# Patient Record
Sex: Male | Born: 1940 | Race: White | Hispanic: No
Health system: Southern US, Community
[De-identification: ages and names within clinical notes are randomized; demographics above are authoritative.]

---

## 2021-03-12 ENCOUNTER — Other Ambulatory Visit: Payer: Self-pay

## 2021-03-12 ENCOUNTER — Inpatient Hospital Stay
Admission: EM | Admit: 2021-03-12 | Discharge: 2021-04-14 | DRG: 871 | Disposition: E | Payer: Medicare Other | Attending: Internal Medicine | Admitting: Internal Medicine

## 2021-03-12 ENCOUNTER — Emergency Department: Payer: Medicare Other

## 2021-03-12 DIAGNOSIS — A4189 Other specified sepsis: Secondary | ICD-10-CM | POA: Diagnosis present

## 2021-03-12 DIAGNOSIS — E870 Hyperosmolality and hypernatremia: Secondary | ICD-10-CM | POA: Diagnosis not present

## 2021-03-12 DIAGNOSIS — L723 Sebaceous cyst: Secondary | ICD-10-CM | POA: Diagnosis present

## 2021-03-12 DIAGNOSIS — N179 Acute kidney failure, unspecified: Secondary | ICD-10-CM | POA: Diagnosis present

## 2021-03-12 DIAGNOSIS — R778 Other specified abnormalities of plasma proteins: Secondary | ICD-10-CM

## 2021-03-12 DIAGNOSIS — M6282 Rhabdomyolysis: Secondary | ICD-10-CM | POA: Diagnosis present

## 2021-03-12 DIAGNOSIS — Z681 Body mass index (BMI) 19 or less, adult: Secondary | ICD-10-CM

## 2021-03-12 DIAGNOSIS — J9811 Atelectasis: Secondary | ICD-10-CM | POA: Diagnosis present

## 2021-03-12 DIAGNOSIS — Z66 Do not resuscitate: Secondary | ICD-10-CM | POA: Diagnosis not present

## 2021-03-12 DIAGNOSIS — J1282 Pneumonia due to coronavirus disease 2019: Secondary | ICD-10-CM | POA: Diagnosis present

## 2021-03-12 DIAGNOSIS — L729 Follicular cyst of the skin and subcutaneous tissue, unspecified: Secondary | ICD-10-CM | POA: Diagnosis not present

## 2021-03-12 DIAGNOSIS — E872 Acidosis: Secondary | ICD-10-CM | POA: Diagnosis present

## 2021-03-12 DIAGNOSIS — R680 Hypothermia, not associated with low environmental temperature: Secondary | ICD-10-CM | POA: Diagnosis present

## 2021-03-12 DIAGNOSIS — L89151 Pressure ulcer of sacral region, stage 1: Secondary | ICD-10-CM | POA: Diagnosis present

## 2021-03-12 DIAGNOSIS — R748 Abnormal levels of other serum enzymes: Secondary | ICD-10-CM | POA: Diagnosis present

## 2021-03-12 DIAGNOSIS — E860 Dehydration: Secondary | ICD-10-CM

## 2021-03-12 DIAGNOSIS — E43 Unspecified severe protein-calorie malnutrition: Secondary | ICD-10-CM | POA: Diagnosis present

## 2021-03-12 DIAGNOSIS — G9341 Metabolic encephalopathy: Secondary | ICD-10-CM | POA: Diagnosis present

## 2021-03-12 DIAGNOSIS — R7989 Other specified abnormal findings of blood chemistry: Secondary | ICD-10-CM

## 2021-03-12 DIAGNOSIS — R6520 Severe sepsis without septic shock: Secondary | ICD-10-CM | POA: Diagnosis present

## 2021-03-12 DIAGNOSIS — Z515 Encounter for palliative care: Secondary | ICD-10-CM

## 2021-03-12 DIAGNOSIS — U071 COVID-19: Secondary | ICD-10-CM | POA: Diagnosis present

## 2021-03-12 DIAGNOSIS — J9601 Acute respiratory failure with hypoxia: Secondary | ICD-10-CM | POA: Diagnosis present

## 2021-03-12 DIAGNOSIS — Z28310 Unvaccinated for covid-19: Secondary | ICD-10-CM

## 2021-03-12 DIAGNOSIS — Z7189 Other specified counseling: Secondary | ICD-10-CM | POA: Diagnosis not present

## 2021-03-12 DIAGNOSIS — R0602 Shortness of breath: Secondary | ICD-10-CM

## 2021-03-12 DIAGNOSIS — N184 Chronic kidney disease, stage 4 (severe): Secondary | ICD-10-CM | POA: Diagnosis present

## 2021-03-12 DIAGNOSIS — R222 Localized swelling, mass and lump, trunk: Secondary | ICD-10-CM | POA: Diagnosis present

## 2021-03-12 DIAGNOSIS — F0390 Unspecified dementia without behavioral disturbance: Secondary | ICD-10-CM | POA: Diagnosis present

## 2021-03-12 DIAGNOSIS — L8990 Pressure ulcer of unspecified site, unspecified stage: Secondary | ICD-10-CM | POA: Insufficient documentation

## 2021-03-12 DIAGNOSIS — R4182 Altered mental status, unspecified: Secondary | ICD-10-CM

## 2021-03-12 LAB — CBC WITH DIFFERENTIAL/PLATELET
Abs Immature Granulocytes: 0.04 10*3/uL (ref 0.00–0.07)
Basophils Absolute: 0 10*3/uL (ref 0.0–0.1)
Basophils Relative: 0 %
Eosinophils Absolute: 0 10*3/uL (ref 0.0–0.5)
Eosinophils Relative: 0 %
HCT: 51.3 % (ref 39.0–52.0)
Hemoglobin: 18.3 g/dL — ABNORMAL HIGH (ref 13.0–17.0)
Immature Granulocytes: 0 %
Lymphocytes Relative: 9 %
Lymphs Abs: 1 10*3/uL (ref 0.7–4.0)
MCH: 31.5 pg (ref 26.0–34.0)
MCHC: 35.7 g/dL (ref 30.0–36.0)
MCV: 88.3 fL (ref 80.0–100.0)
Monocytes Absolute: 0.7 10*3/uL (ref 0.1–1.0)
Monocytes Relative: 6 %
Neutro Abs: 9.9 10*3/uL — ABNORMAL HIGH (ref 1.7–7.7)
Neutrophils Relative %: 85 %
Platelets: 149 10*3/uL — ABNORMAL LOW (ref 150–400)
RBC: 5.81 MIL/uL (ref 4.22–5.81)
RDW: 13.4 % (ref 11.5–15.5)
WBC: 11.7 10*3/uL — ABNORMAL HIGH (ref 4.0–10.5)
nRBC: 0.2 % (ref 0.0–0.2)

## 2021-03-12 LAB — URINALYSIS, COMPLETE (UACMP) WITH MICROSCOPIC
Bacteria, UA: NONE SEEN
Bilirubin Urine: NEGATIVE
Glucose, UA: NEGATIVE mg/dL
Ketones, ur: NEGATIVE mg/dL
Leukocytes,Ua: NEGATIVE
Nitrite: NEGATIVE
Protein, ur: NEGATIVE mg/dL
Specific Gravity, Urine: 1.017 (ref 1.005–1.030)
Squamous Epithelial / HPF: NONE SEEN (ref 0–5)
pH: 5 (ref 5.0–8.0)

## 2021-03-12 LAB — URINE DRUG SCREEN, QUALITATIVE (ARMC ONLY)
Amphetamines, Ur Screen: NOT DETECTED
Barbiturates, Ur Screen: NOT DETECTED
Benzodiazepine, Ur Scrn: NOT DETECTED
Cannabinoid 50 Ng, Ur ~~LOC~~: NOT DETECTED
Cocaine Metabolite,Ur ~~LOC~~: NOT DETECTED
MDMA (Ecstasy)Ur Screen: NOT DETECTED
Methadone Scn, Ur: NOT DETECTED
Opiate, Ur Screen: NOT DETECTED
Phencyclidine (PCP) Ur S: NOT DETECTED
Tricyclic, Ur Screen: NOT DETECTED

## 2021-03-12 LAB — COMPREHENSIVE METABOLIC PANEL
ALT: 26 U/L (ref 0–44)
AST: 34 U/L (ref 15–41)
Albumin: 4.3 g/dL (ref 3.5–5.0)
Alkaline Phosphatase: 67 U/L (ref 38–126)
Anion gap: 14 (ref 5–15)
BUN: 72 mg/dL — ABNORMAL HIGH (ref 8–23)
CO2: 17 mmol/L — ABNORMAL LOW (ref 22–32)
Calcium: 9.7 mg/dL (ref 8.9–10.3)
Chloride: 106 mmol/L (ref 98–111)
Creatinine, Ser: 2.85 mg/dL — ABNORMAL HIGH (ref 0.61–1.24)
GFR, Estimated: 22 mL/min — ABNORMAL LOW (ref >60)
Glucose, Bld: 169 mg/dL — ABNORMAL HIGH (ref 70–99)
Potassium: 4.2 mmol/L (ref 3.5–5.1)
Sodium: 137 mmol/L (ref 135–145)
Total Bilirubin: 1.9 mg/dL — ABNORMAL HIGH (ref 0.3–1.2)
Total Protein: 7.9 g/dL (ref 6.5–8.1)

## 2021-03-12 LAB — RESP PANEL BY RT-PCR (FLU A&B, COVID) ARPGX2
Influenza A by PCR: NEGATIVE
Influenza B by PCR: NEGATIVE
SARS Coronavirus 2 by RT PCR: POSITIVE — AB

## 2021-03-12 LAB — CBG MONITORING, ED: Glucose-Capillary: 197 mg/dL — ABNORMAL HIGH (ref 70–99)

## 2021-03-12 LAB — LACTIC ACID, PLASMA
Lactic Acid, Venous: 2.6 mmol/L (ref 0.5–1.9)
Lactic Acid, Venous: 3 mmol/L (ref 0.5–1.9)

## 2021-03-12 LAB — PROCALCITONIN: Procalcitonin: 0.21 ng/mL

## 2021-03-12 LAB — PROTIME-INR
INR: 2.2 — ABNORMAL HIGH (ref 0.8–1.2)
Prothrombin Time: 24.4 seconds — ABNORMAL HIGH (ref 11.4–15.2)

## 2021-03-12 LAB — AMMONIA: Ammonia: 12 umol/L (ref 9–35)

## 2021-03-12 LAB — APTT: aPTT: 34 seconds (ref 24–36)

## 2021-03-12 LAB — TROPONIN I (HIGH SENSITIVITY)
Troponin I (High Sensitivity): 30 ng/L — ABNORMAL HIGH (ref <18)
Troponin I (High Sensitivity): 26 ng/L — ABNORMAL HIGH (ref <18)

## 2021-03-12 LAB — CK: Total CK: 746 U/L — ABNORMAL HIGH (ref 49–397)

## 2021-03-12 LAB — MAGNESIUM: Magnesium: 3 mg/dL — ABNORMAL HIGH (ref 1.7–2.4)

## 2021-03-12 LAB — ETHANOL: Alcohol, Ethyl (B): <10 mg/dL (ref <10)

## 2021-03-12 MED ORDER — METHYLPREDNISOLONE SODIUM SUCC 125 MG IJ SOLR
60.0000 mg | Freq: Two times a day (BID) | INTRAMUSCULAR | Status: AC
  Administered 2021-03-13 – 2021-03-14 (×4): 60 mg via INTRAVENOUS
  Filled 2021-03-12 (×4): qty 2

## 2021-03-12 MED ORDER — REMDESIVIR 100 MG IV SOLR
100.0000 mg | Freq: Every day | INTRAVENOUS | Status: AC
Start: 2021-03-13 — End: 2021-03-16
  Administered 2021-03-13 – 2021-03-16 (×4): 100 mg via INTRAVENOUS
  Filled 2021-03-12 (×4): qty 100

## 2021-03-12 MED ORDER — SODIUM CHLORIDE 0.9 % IV SOLN
200.0000 mg | Freq: Once | INTRAVENOUS | Status: AC
  Administered 2021-03-12: 200 mg via INTRAVENOUS
  Filled 2021-03-12: qty 200

## 2021-03-12 MED ORDER — HEPARIN SODIUM (PORCINE) 5000 UNIT/ML IJ SOLN
5000.0000 [IU] | Freq: Three times a day (TID) | INTRAMUSCULAR | Status: DC
  Administered 2021-03-12 – 2021-03-16 (×11): 5000 [IU] via SUBCUTANEOUS
  Filled 2021-03-12 (×11): qty 1

## 2021-03-12 MED ORDER — DEXAMETHASONE SODIUM PHOSPHATE 10 MG/ML IJ SOLN
6.0000 mg | Freq: Once | INTRAMUSCULAR | Status: AC
  Administered 2021-03-12: 6 mg via INTRAVENOUS
  Filled 2021-03-12: qty 1

## 2021-03-12 MED ORDER — SODIUM CHLORIDE 0.9 % IV SOLN: INTRAVENOUS | Status: DC

## 2021-03-12 MED ORDER — LACTATED RINGERS IV BOLUS
1000.0000 mL | Freq: Once | INTRAVENOUS | Status: AC
  Administered 2021-03-12: 1000 mL via INTRAVENOUS

## 2021-03-12 MED ORDER — ONDANSETRON HCL 4 MG PO TABS
4.0000 mg | ORAL_TABLET | Freq: Four times a day (QID) | ORAL | Status: DC | PRN
Start: 2021-03-12 — End: 2021-03-16

## 2021-03-12 MED ORDER — LABETALOL HCL 5 MG/ML IV SOLN: 5.0000 mg | INTRAVENOUS | Status: AC | PRN

## 2021-03-12 MED ORDER — HYDROCOD POLST-CPM POLST ER 10-8 MG/5ML PO SUER: 5.0000 mL | Freq: Every evening | ORAL | Status: DC | PRN

## 2021-03-12 MED ORDER — GUAIFENESIN-DM 100-10 MG/5ML PO SYRP: 10.0000 mL | ORAL_SOLUTION | ORAL | Status: DC | PRN

## 2021-03-12 MED ORDER — ACETAMINOPHEN 325 MG PO TABS: 325.0000 mg | ORAL_TABLET | Freq: Four times a day (QID) | ORAL | Status: AC | PRN

## 2021-03-12 MED ORDER — POTASSIUM CHLORIDE 10 MEQ/100ML IV SOLN: 10.0000 meq | INTRAVENOUS | Status: DC

## 2021-03-12 MED ORDER — ONDANSETRON HCL 4 MG/2ML IJ SOLN: 4.0000 mg | Freq: Four times a day (QID) | INTRAMUSCULAR | Status: DC | PRN

## 2021-03-12 NOTE — ED Triage Notes (Signed)
Pt to ED via [facility] from home. Per EMS neighbor called after checking on pt and found him on the floor unresponsive. Pt RA 90% and placed on 4L [place]. BP 135/80. CBG 157. Pt presents with strong ammonia smell and large cyst on right rib cage.

## 2021-03-12 NOTE — ED Notes (Signed)
Pt appears more alert after second back of fluids, now able to tell me his name and that he is in the hospital. Provider stated he was able to shake his head yes and no to questions.

## 2021-03-12 NOTE — Consult Note (Signed)
Remdesivir - Pharmacy Brief Note   O:  ALT: 26 CXR: N/A SpO2: 98% on 4L [place]   A/P:  Remdesivir 200 mg IVPB once followed by 100 mg IVPB daily x 4 days.   Darnelle Bos, PharmD 02/15/2021 7:33 PM

## 2021-03-12 NOTE — ED Notes (Signed)
admit Provider at bedside. 

## 2021-03-12 NOTE — H&P (Signed)
History and Physical   Matthew Aguilar HKV:[id number] DOB: [birth year] DOA: 03/10/2021  PCP: No primary care provider on file.  Patient coming from: Home via EMS  I have personally briefly reviewed patient's old medical records in [place].  Chief Concern: Unresponsive  HPI: Matthew Aguilar is a 80 y.o. male with medical history significant for no prior documented medical history in EHR, presents to the emergency department for chief concerns of found unresponsive.  At bedside patient is able to tell me his first name and his last name, his age, and that he is in the hospital.  He was not able to answer me the current calendar year.  He endorses cough and shortness of breath.  He shook his head in denial of headache, vision changes, fever, chest pain, abdominal pain, dysuria, diarrhea.  When asked how long he has had cough and shortness of breath, he responded several days.  He did shook his head nodding in agreement that he does feel better after the ED intervention.  He states he is not currently taking any medications.  Social history: He lives at home by himself and denies family.  He denies EtOH and recreational drug use.  Vaccination history: He denies being vaccinated for COVID-19  ROS: Constitutional: no weight change, no fever ENT/Mouth: no sore throat, no rhinorrhea Eyes: no eye pain, no vision changes Cardiovascular: no chest pain, + dyspnea,  no edema, no palpitations Respiratory: + cough, no sputum, no wheezing Gastrointestinal: no nausea, no vomiting, no diarrhea, no constipation Genitourinary: no urinary incontinence, no dysuria, no hematuria Musculoskeletal: no arthralgias, no myalgias Skin: no skin lesions, no pruritus, Neuro: + weakness, no loss of consciousness, no syncope Psych: no anxiety, no depression, + decrease appetite Heme/Lymph: no bruising, no bleeding  ED Course: Discussed with emergency medicine provider, patient requiring  hospitalization for acute hypoxic respiratory failure.  Per EDP, a neighbor came into check on patient and he was found minimally unresponsive.  EMS was called and per EMS patient was hypoxic in the mid 80s on room air.  EMS started patient on 4 L nasal cannula and his SPO2 improved to greater than 92%.  Vitals in the emergency department was remarkable for temperature 96.8, respiration rate of 21, heart rate 88, blood pressure 126/83, SPO2 of 99% on 4 L nasal cannula.  Labs in the emergency department was remarkable for COVID positive.  Influenza A/influenza B PCR were negative.  CK7 46, troponin was 38 and decreased to 26.  UA was negative for nitrates and leukocytes.  WBC was elevated at 11.7, hemoglobin 18.3, platelets 149, BUN 70, serum creatinine of 2.83, nonfasting blood glucose 169.  EDP ordered Decadron 6 mg IV, 2 L lactated ringer bolus, remdesivir IV.  Per EDP, at baseline patient is able to give thumbs up or thumbs down.  Patient was pan scanned and negative for cervical, thoracic, lumbar fracture.  CT of the head without contrast was negative for acute intracranial bleed.  CT of the chest abdomen pelvis without contrast was read as lobulated 12.5x 6 x 9.5 cm fluid density lesion along the right lateral upper abdomen subcutaneus soft tissues is incompletely evaluated on this noncontrast study. Finding could represent a sebaceous cyst versus liquefied hematoma versus likely benign neoplasm.  Assessment/Plan  Active Problems:   Acute hypoxemic respiratory failure (HCC)   CKD (chronic kidney disease), stage IV ([place])   # Altered mental status I suspect this is secondary to acute hypoxic respiratory failure  secondary to COVID-19 # Leukocytosis -Met sepsis criteria with increased respiration rate, increased lactic acid of 2.6, leukocytosis, source of COVID-19 - Ammonia is within normal limits at 12, ethanol was less than 10, UDS was positive - CT the head without contrast was read  as negative for acute large intracranial abnormality including hemorrhage - IV remdesivir per pharmacy, IV solumedrol 60 mg q12h initiated  - Procalcitonin baseline is 0.21, therefore antibiotic has not been initiated - Incentive spirometry and flutter valve for 10 reps every 2 hours while awake - Daily labs: CMP, CBC, D-dimer - Supplemental oxygen to maintain SPO2 goal of greater than 88% - Continue airborne and contact precautions - Admit to MedSurg, inpatient, telemetry  # Elevated CK-I suspect this is multifactorial including acute kidney injury and COVID-19 infection - Status post 2 L lactated Ringer's bolus per EDP - In setting of COVID-19 infection, with concurrent dry mucosa, I will start IVF at normal saline 75 mL/h, for 13 hours to complete 1 bag - Check CK in the a.m.  # Serum creatinine on presentation is 2.85, BUN 72, EGFR 22 - No baseline serum creatinine to compare - This could be acute kidney injury in setting of covid 19 versus CKD stage IV  # Mild hypothermia-Bair hugger ordered # Right lobulated fluid-filled cysts-present admission  # Right-sided anterior mid rib loculated cyst-present admission, images taken in media  # A.m. team to complete med reconciliation  Chart reviewed.  No prior medical records in chart  DVT prophylaxis: Heparin 5000 units every 8 hours subcutaneous Code Status: Limited.  He shook his head in denial of chest compression.  He does nod his head stating that if he needed it, he would be okay with intubation. Diet: Heart healthy Family Communication: No Disposition Plan: Pending clinical course Consults called: None at this time Admission status: MedSurg, inpatient, telemetry ordered  No past medical history on file.  Social History:  has no history on file for tobacco use, alcohol use, and drug use.  Not on File No family history on file. Family history: Family history reviewed and not pertinent  Prior to Admission medications    Not on File   Physical Exam: Vitals:   03/13/2021 1755 02/21/2021 1955 03/02/2021 2000 02/19/2021 2005  BP:  136/90 138/90 (!) 147/92  Pulse:  77 77 83  Resp:  (!) _0 Temp:      TempSrc:      SpO2:  99% 99% 99%  Weight: 62.6 kg     Height: 6' (1.829 m)      Constitutional: appears age-appropriate, poorly kept, frail, NAD, calm, comfortable Eyes: PERRL, lids and conjunctivae normal ENMT: Mucous membranes are moist. Posterior pharynx clear of any exudate or lesions. Age-appropriate dentition. Hearing appropriate Neck: normal, supple, no masses, no thyromegaly Respiratory: clear to auscultation bilaterally, no wheezing, no crackles. Normal respiratory effort. No accessory muscle use.  Cardiovascular: Regular rate and rhythm, no murmurs / rubs / gallops. No extremity edema. 2+ pedal pulses. No carotid bruits.  Abdomen: Scaphoid abdomen, no tenderness, no masses palpated, no hepatosplenomegaly. Bowel sounds positive. Right upper abdomen loculated cysts.  Musculoskeletal: no clubbing / cyanosis. No joint deformity upper and lower extremities.  Generalized extremity weakness and muscle wasting/atrophic.  Generalized decreased extremity muscle tone. Skin: no rashes, lesions, ulcers. No induration Neurologic: Sensation intact. Strength 5/5 in all 4.  Psychiatric: Patient is minimally verbal and does not volunteer numerous verbal statements therefore unable to assess patient's judgment and insight. Alert  and oriented x self, age, location.  Flat affect  EKG: independently reviewed, showing sinus rhythm with rate of 97, QTc 520  Imaging on Admission: I personally reviewed and I agree with radiologist reading as below.  FINDINGS: CT HEAD FINDINGS  Brain:  Cerebral ventricle sizes are concordant with the degree of cerebral volume loss. Patchy and confluent areas of decreased attenuation are noted throughout the deep and periventricular white matter of the cerebral hemispheres bilaterally,  compatible with chronic 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  No evidence of large-territorial acute infarction. No parenchymal hemorrhage. No mass lesion. No extra-axial collection.  No mass effect or midline shift. No hydrocephalus. Basilar cisterns are patent.  Vascular: No hyperdense vessel.  Skull: No acute fracture or focal lesion.  Sinuses/Orbits: Mucosal thickening of the right frontal sinus. Otherwise the remaining paranasal sinuses and mastoid air cells are clear. The orbits are unremarkable.  Other: None.  CT CERVICAL FINDINGS  Alignment: Grade 1 anterolisthesis of C4 on C5 likely due to degenerative changes.  Skull base and vertebrae: Multilevel moderate to severe degenerative changes of the spine with associated multilevel severe osseous neural foraminal stenosis. No severe osseous central canal stenosis. No acute fracture. No aggressive appearing focal osseous lesion or focal pathologic process.  Soft tissues and spinal canal: No prevertebral fluid or swelling. No visible canal hematoma.  Upper chest: Unremarkable.  Other: Patient is edentulous.  CT CHEST: Ports and Devices: None.  Lungs/airways:  Bilateral lower lobe subsegmental atelectasis. Question bilateral lower lobe reticulations with limited evaluation due to motion artifact. No focal consolidation. Scattered calcified and noncalcified pulmonary micronodules. No pulmonary mass. No pulmonary contusion or laceration. No pneumatocele formation.  The central airways are patent.  Pleura: No pleural effusion. No pneumothorax. No hemothorax.  Lymph Nodes: Limited evaluation for hilar lymphadenopathy on this noncontrast study. No mediastinal or axillary lymphadenopathy.  Mediastinum:  No pneumomediastinum.  The thoracic aorta is normal in caliber. Atherosclerotic plaque. Four-vessel coronary calcifications. The heart is normal in size. No significant pericardial  effusion. The esophagus is unremarkable. Possible tiny hiatal hernia.  The thyroid is unremarkable.  Chest Wall / Breasts: No chest wall mass.  Musculoskeletal: No acute displaced rib or sternal fracture. Limited evaluation due to respiratory motion artifact.  Visualized portions of bilateral upper extremities grossly unremarkable.  CT ABDOMEN / PELVIS: Liver: Not enlarged. No focal lesion.  Biliary System: The gallbladder distended with fluid with otherwise no radio-opaque gallstones. No biliary ductal dilatation.  Pancreas: Limited evaluation due to respiratory motion artifact. Diffusely atrophic. No focal lesion. Otherwise normal pancreatic contour. No surrounding inflammatory changes. No main pancreatic ductal dilatation. Spleen: Not enlarged. No focal lesion.  Adrenal Glands: No nodularity bilaterally.  Kidneys:  Bilateral renal cortical scarring. Bilateral nephrolithiasis measuring up to 8 mm on the left and 4 mm on the right. No hydroureteronephrosis bilaterally. Three consecutive calcifications within the left pelvis measuring up to 9 mm likely within the distal left ureter (5:90-93). No right ureterolithiasis. No contour deforming renal mass.  The urinary bladder is unremarkable.  Bowel: No small or large bowel wall thickening or dilatation. Diffuse colonic diverticulosis. Poorly visualized transverse colon due to motion artifact. The appendix is unremarkable.  Mesentery, Omentum, and Peritoneum: No simple free fluid ascites. No pneumoperitoneum. No mesenteric hematoma identified. No organized fluid collection.  Pelvic Organs: The prostate is prominent..  Lymph Nodes: No abdominal, pelvic, inguinal lymphadenopathy.  Vasculature: No abdominal aorta or iliac aneurysm.  Musculoskeletal:  There is a lobulated 12.5 x 6  by 9.5 cm fluid density lesion along right lateral upper abdomen subcutaneus soft tissues (2:73, 5:38).  Bilateral, right greater than  left, fat containing inguinal hernias.  No acute pelvic fracture.  CT THORACIC SPINE FINDINGS  Alignment: Normal.  Vertebrae: Multilevel mild degenerative changes of the spine. No acute fracture or focal pathologic process.No severe osseous neural foraminal or central canal stenosis.  Paraspinal and other soft tissues: Negative.  CT LUMBAR SPINE FINDINGS  Segmentation: 5 lumbar type vertebrae.  Alignment: Normal.  Vertebrae: Age-indeterminate, likely chronic, L1 compression fracture with greater than 20% height loss. Otherwise no definite acute fracture or focal pathologic process. Multilevel mild degenerative changes of the spine with moderate severe facet arthropathy of the lower lumbar spine. No severe osseous neural foraminal or central canal stenosis.  Paraspinal and other soft tissues: Negative.  IMPRESSION: 1. 2. No acute intracranial abnormality. 3. No acute displaced fracture or traumatic listhesis of the cervical spine. 4. No acute traumatic injury to the chest, abdomen, or pelvis with limited evaluation on this noncontrast study. Please see below regarding positive non-traumatic findings.  5. No definite acute fracture or traumatic malalignment of the thoracic or lumbar spine. Age-indeterminate, likely chronic, L1 compression fracture with greater than 20% height loss. Correlate with tenderness to palpation to evaluate for an acute component. 6. Some limited evaluation due to respiratory motion artifact.  Other imaging findings of potential clinical significance:  1. Three consecutive calcifications within the left pelvis measuring up to 9 mm likely within the distal left ureter. No associated proximal hydroureteronephrosis. Recommend correlation with urinalysis. 2. Bilateral nonobstructive nephrolithiasis measuring up to 8 mm on the left and 4 mm on the right. 3. A lobulated 12.5 x 6 x 9.5 cm fluid density lesion along the right lateral upper abdomen  subcutaneus soft tissues is incompletely evaluated on this noncontrast study. Finding could represent a sebaceous cyst versus liquefied hematoma versus likely benign neoplasm. Correlate with physical exam. Correlate with prior cross-sectional imaging to evaluate for stability. 4. Scattered pulmonary micronodules calcified and noncalcified. No follow-up needed if patient is low-risk (and has no known or suspected primary neoplasm). Non-contrast chest CT can be considered in 12 months if patient is high-risk. This recommendation follows the consensus statement: Guidelines for Management of Incidental Pulmonary Nodules Detected on CT Images: From the Fleischner Society 2017; Radiology 2017; 284:228-243. 5. Diffuse colonic diverticulosis with no acute diverticulitis. 6. Prominent prostate 7. Bilateral, right greater than left, fat containing inguinal hernias. 8. Aortic Atherosclerosis (ICD10-I70.0).  Electronically Signed By: Iven Finn M.D. On: 02/14/2021 20:28  Labs on Admission: I have personally reviewed following labs  CBC: Recent Labs  Lab 02/15/2021 1756  WBC 11.7*  NEUTROABS 9.9*  HGB 18.3*  HCT 51.3  MCV 88.3  PLT 818*   Basic Metabolic Panel: Recent Labs  Lab 02/25/2021 1756  NA 137  K 4.2  CL 106  CO2 17*  GLUCOSE 169*  BUN 72*  CREATININE 2.85*  CALCIUM 9.7  MG 3.0*   GFR: Estimated Creatinine Clearance: 18.3 mL/min (A) (by C-G formula based on SCr of 2.85 mg/dL (H)). Liver Function Tests: Recent Labs  Lab 02/16/2021 1756  AST 34  ALT 26  ALKPHOS 67  BILITOT 1.9*  PROT 7.9  ALBUMIN 4.3   No results for input(s): LIPASE, AMYLASE in the last 168 hours. Recent Labs  Lab 03/06/2021 2009  AMMONIA 12   Coagulation Profile: Recent Labs  Lab 02/12/2021 1756  INR 2.2*   Cardiac Enzymes: Recent Labs  Lab  02/15/2021 1756  CKTOTAL 746*   CBG: Recent Labs  Lab 02/20/2021 1801  GLUCAP 197*   Urine analysis:    Component Value Date/Time    COLORURINE AMBER (A) 02/20/2021 1756   APPEARANCEUR CLEAR (A) 03/06/2021 1756   LABSPEC 1.017 02/16/2021 1756   PHURINE 5.0 03/07/2021 1756   GLUCOSEU NEGATIVE 02/23/2021 1756   HGBUR SMALL (A) 03/02/2021 1756   BILIRUBINUR NEGATIVE 03/03/2021 1756   KETONESUR NEGATIVE 02/15/2021 1756   PROTEINUR NEGATIVE 03/04/2021 1756   NITRITE NEGATIVE 02/26/2021 1756   LEUKOCYTESUR NEGATIVE 02/18/2021 1756   Dr. Tobie Poet [facility]  If 7PM-7AM, please contact overnight-coverage provider If 7AM-7PM, please contact day coverage provider [email or web address]  02/12/2021, 9:08 PM

## 2021-03-12 NOTE — ED Provider Notes (Signed)
[facility] Emergency Department Provider Note  ____________________________________________   Event Date/Time   First MD Initiated Contact with Patient 02/16/2021 1753     (approximate)  I have reviewed the triage vital signs and the nursing notes.   HISTORY  Chief Complaint Altered Mental Status (Unresponsive )   HPI Matthew Aguilar is a 80 y.o. male patient presents without known medical history of EMS from home after he was reportedly found on the floor surrounded by urine and feces when a neighbor went to check on him.  Per EMS he was virtually unresponsive with them and was hypoxic in the mid 80s on room air requiring 4 L nasal cannula to maintain SPO2 greater than 92%.  Otherwise he was with stable vitals and normal glucose but unable provide any additional history.  No additional history is available from EMS.  No contact is available to obtain collateral on patient arrival.         No past medical history on file.  There are no problems to display for this patient.     Prior to Admission medications   Not on File    Allergies Patient has no allergy information on record.  No family history on file.  Social History    Review of Systems  Review of Systems  Unable to perform ROS: Mental status change     ____________________________________________   PHYSICAL EXAM:  VITAL SIGNS: ED Triage Vitals [03/09/2021 1751]  Enc Vitals Group     BP 126/83     Pulse Rate 88     Resp (!) 21     Temp (!) 96.8 F (36 C)     Temp Source Rectal     SpO2 98 %     Weight      Height      Head Circumference      Peak Flow      Pain Score      Pain Loc      Pain Edu?      Excl. in [place]?    Vitals:   03/10/2021 1751 03/09/2021 1955  BP: 126/83 136/90  Pulse: 88 77  Resp: (!) 21 (!) 26  Temp: (!) 96.8 F (36 C)   SpO2: 98% 99%   Physical Exam Vitals and nursing note reviewed.  Constitutional:      Appearance: He is  ill-appearing.  HENT:     Head: Normocephalic.     Right Ear: External ear normal.     Left Ear: External ear normal.     Nose: Nose normal.     Mouth/Throat:     Mouth: Mucous membranes are dry.  Eyes:     General: No scleral icterus.    Pupils: Pupils are equal, round, and reactive to light.  Cardiovascular:     Rate and Rhythm: Normal rate and regular rhythm.  Pulmonary:     Effort: No respiratory distress.  Abdominal:     General: There is no distension.     Tenderness: There is no right CVA tenderness or left CVA tenderness.  Musculoskeletal:        General: Signs of injury present.     Right lower leg: No edema.     Left lower leg: No edema.  Skin:    General: Skin is dry.     Capillary Refill: Capillary refill takes more than 3 seconds.  Neurological:     Mental Status: He is disoriented and confused.  GCS: GCS eye subscore is 4. GCS verbal subscore is 4. GCS motor subscore is 6.    No step-offs or deformities over the C/C/L-spine.  2+ radial and DP pulses.  There are some scattered bruising over the left lower leg without effusion deformity or apparent significant tenderness of the bilateral hips, knees or ankles.  Patient withdraws to noxious stimuli in all extremities.  He is able to give examiner thumbs up with both upper extremities.  Is able to track this examiner appropriately with his eyes and open his mouth on command.  PERRLA.  No obvious trauma to the face scalp head or neck.  There is a fairly large mass in the right lower chest just above the 12th rib approximately 8 to 10 cm in diameter circular. ____________________________________________   LABS (all labs ordered are listed, but only abnormal results are displayed)  Labs Reviewed  RESP PANEL BY RT-PCR (FLU A&B, COVID) ARPGX2 - Abnormal; Notable for the following components:      Result Value   SARS Coronavirus 2 by RT PCR POSITIVE (*)    All other components within normal limits  COMPREHENSIVE  METABOLIC PANEL - Abnormal; Notable for the following components:   CO2 17 (*)    Glucose, Bld 169 (*)    BUN 72 (*)    Creatinine, Ser 2.85 (*)    Total Bilirubin 1.9 (*)    GFR, Estimated 22 (*)    All other components within normal limits  CBC WITH DIFFERENTIAL/PLATELET - Abnormal; Notable for the following components:   WBC 11.7 (*)    Hemoglobin 18.3 (*)    Platelets 149 (*)    Neutro Abs 9.9 (*)    All other components within normal limits  PROTIME-INR - Abnormal; Notable for the following components:   Prothrombin Time 24.4 (*)    INR 2.2 (*)    All other components within normal limits  URINALYSIS, COMPLETE (UACMP) WITH MICROSCOPIC - Abnormal; Notable for the following components:   Color, Urine AMBER (*)    APPearance CLEAR (*)    Hgb urine dipstick SMALL (*)    All other components within normal limits  MAGNESIUM - Abnormal; Notable for the following components:   Magnesium 3.0 (*)    All other components within normal limits  CK - Abnormal; Notable for the following components:   Total CK 746 (*)    All other components within normal limits  CBG MONITORING, ED - Abnormal; Notable for the following components:   Glucose-Capillary 197 (*)    All other components within normal limits  TROPONIN I (HIGH SENSITIVITY) - Abnormal; Notable for the following components:   Troponin I (High Sensitivity) 30 (*)    All other components within normal limits  CULTURE, BLOOD (SINGLE)  URINE CULTURE  APTT  PROCALCITONIN  ETHANOL  URINE DRUG SCREEN, QUALITATIVE ([facility] ONLY)  LACTIC ACID, PLASMA  AMMONIA  LACTIC ACID, PLASMA  TROPONIN I (HIGH SENSITIVITY)   ____________________________________________  EKG  Sinus rhythm with a ventricular of 97, low voltage throughout, Q waves in inferior leads and QTc interval of 520 without other evidence of clear ischemia or significant arrhythmia. ____________________________________________  RADIOLOGY  ED MD interpretation: CT head  shows no evidence of acute intracranial hemorrhage skull fracture or other clear acute process.  There is no acute traumatic listhesis or injury noted on the C-spine.  CT chest on pelvis has no evidence of acute orthopedic or visceral injury or bleeding.  Age-indeterminate likely chronic L1 compression  fracture.  There are also some calcifications in the left renal pelvis and the distal ureter but not associate with proximal hydronephrosis or perinephric stranding.  There is also bilateral nonobstructive kidney stones and a lobulated fluid density along the right lateral upper abdomen biopsy representing sebaceous cyst versus hematoma versus benign neoplasm.  There is also evidence of colonic diverticulosis without diverticulitis and a prominent prostate as well as bilateral greater on the right and left inguinal hernias.  Aortic atherosclerosis.  No other clear acute process on the chest abdomen or pelvis.  Official radiology report(s): No results found. 1. 2. No acute intracranial abnormality. 3. No acute displaced fracture or traumatic listhesis of the cervical spine. 4. No acute traumatic injury to the chest, abdomen, or pelvis with limited evaluation on this noncontrast study. Please see below regarding positive non-traumatic findings.  5. No definite acute fracture or traumatic malalignment of the thoracic or lumbar spine. Age-indeterminate, likely chronic, L1 compression fracture with greater than 20% height loss. Correlate with tenderness to palpation to evaluate for an acute component. 6. Some limited evaluation due to respiratory motion artifact.  Other imaging findings of potential clinical significance:  1. Three consecutive calcifications within the left pelvis measuring up to 9 mm likely within the distal left ureter. No associated proximal hydroureteronephrosis. Recommend correlation with urinalysis. 2. Bilateral nonobstructive nephrolithiasis measuring up to 8 mm on the left  and 4 mm on the right. 3. A lobulated 12.5 x 6 x 9.5 cm fluid density lesion along the right lateral upper abdomen subcutaneus soft tissues is incompletely evaluated on this noncontrast study. Finding could represent a sebaceous cyst versus liquefied hematoma versus likely benign neoplasm. Correlate with physical exam. Correlate with prior cross-sectional imaging to evaluate for stability. 4. Scattered pulmonary micronodules calcified and noncalcified. No follow-up needed if patient is low-risk (and has no known or suspected primary neoplasm). Non-contrast chest CT can be considered in 12 months if patient is high-risk. This recommendation follows the consensus statement: Guidelines for Management of Incidental Pulmonary Nodules Detected on CT Images: From the Fleischner Society 2017; Radiology 2017; 284:228-243. 5. Diffuse colonic diverticulosis with no acute diverticulitis. 6. Prominent prostate 7. Bilateral, right greater than left, fat containing inguinal hernias. 8. Aortic Atherosclerosis (ICD10-I70.0).  ____________________________________________   PROCEDURES  Procedure(s) performed (including Critical Care):  Procedures  45 minutes of critical care time performed.  This was in part to treat patient's acute hypoxic respiratory failure. ____________________________________________   INITIAL IMPRESSION / ASSESSMENT AND PLAN / ED COURSE      Patient presents with above-stated history and exam after being found down by a neighbor in his home.  He was reportedly surrounded by urine and feces minimally responsive EMS.  On arrival he is slightly tachypneic but afebrile requiring 2 L to maintain SPO2 greater than 94%.  On room air his SPO2 is 86%.  He does appear extremely dry and is only able to state his name with significant verbal stimulation.  He is able to give this examiner thumbs up with both hands and while he does not move his lower extremities on command he does  withdraw both to noxious stimuli.  He has some scattered bruising in his left lower leg but no other obvious evidence of trauma.  He does have a notable fairly large mass on his right lower chest.  Differential with regards to precipitating stimulus include ACS, arrhythmia, CVA, sepsis, metabolic derangements, toxic ingestion  Will order for sepsis work-up in addition to EKG troponin and  CK.  To assess for possible acute injuries SAH subacute CVA or foci of infection we will obtain full trauma pan scans including CT head, C-spine and chest abdomen pelvis.  Given appearance of fairly significant dehydration on exam we will administer 1 L of IV fluids at this time.  CMP remarkable for bicarb of 17 with normal anion gap, BUN of 72 and a creatinine of 2.85.  CBC with a leukocytosis with WBC count of 11.7 and hemoglobin of 18.3.  UA has small hemoglobin but otherwise is unremarkable for evidence of infection or blood.  Initial troponin slight elevated at 30 which I suspect represents to mild demand ischemia in the setting of fairly severe dehydration and COVID, we will plan to trend this.  COVID PCR did return positive.  Given patient is requiring oxygen will start on Decadron and remdesivir.  Magnesium 3.  CK slightly elevated at 746.  Procalcitonin 0.21.  Ethanol undetectable.  UDS negative.  INR is 2.2.   CT head shows no evidence of acute intracranial hemorrhage skull fracture or other clear acute process.  There is no acute traumatic listhesis or injury noted on the C-spine.  CT chest on pelvis has no evidence of acute orthopedic or visceral injury or bleeding.  Age-indeterminate likely chronic L1 compression fracture.  There are also some calcifications in the left renal pelvis and the distal ureter but not associate with proximal hydronephrosis or perinephric stranding.  There is also bilateral nonobstructive kidney stones and a lobulated fluid density along the right lateral upper abdomen biopsy  representing sebaceous cyst versus hematoma versus benign neoplasm.  There is also evidence of colonic diverticulosis without diverticulitis and a prominent prostate as well as bilateral greater on the right and left inguinal hernias.  Aortic atherosclerosis.  No other clear acute process on the chest abdomen or pelvis.  Patient has new oxygen requirement and minimally elevated procalcitonin with a WBC count 11.7 I suspect this is related to old COVID infection and trauma and I have a low suspicion for acute bacterial infection or sepsis at this time.  We will hydrate with IV fluids for antibiotics.  Patient started on remdesivir and Decadron.  I will admit to medical service for further evaluation and management.      ____________________________________________   FINAL CLINICAL IMPRESSION(S) / ED DIAGNOSES  Final diagnoses:  AMS (altered mental status)  Acute respiratory failure with hypoxia (HCC)  AKI (acute kidney injury) ([place])  Dehydration  Troponin I above reference range  COVID    Medications  remdesivir 200 mg in sodium chloride 0.9% 250 mL IVPB (has no administration in time range)    Followed by  remdesivir 100 mg in sodium chloride 0.9 % 100 mL IVPB (has no administration in time range)  lactated ringers bolus 1,000 mL (0 mLs Intravenous Stopped 02/20/2021 2012)  lactated ringers bolus 1,000 mL (1,000 mLs Intravenous New Bag/Given 02/28/2021 2009)  dexamethasone (DECADRON) injection 6 mg (6 mg Intravenous Given 03/02/2021 2009)     ED Discharge Orders     None        Note:  This document was prepared using Dragon voice recognition software and may include unintentional dictation errors.    Lucrezia Starch, MD 02/20/2021 2105

## 2021-03-13 DIAGNOSIS — J9601 Acute respiratory failure with hypoxia: Secondary | ICD-10-CM | POA: Diagnosis not present

## 2021-03-13 DIAGNOSIS — L8990 Pressure ulcer of unspecified site, unspecified stage: Secondary | ICD-10-CM | POA: Insufficient documentation

## 2021-03-13 LAB — CBC WITH DIFFERENTIAL/PLATELET
Abs Immature Granulocytes: 0.05 10*3/uL (ref 0.00–0.07)
Basophils Absolute: 0 10*3/uL (ref 0.0–0.1)
Basophils Relative: 0 %
Eosinophils Absolute: 0 10*3/uL (ref 0.0–0.5)
Eosinophils Relative: 0 %
HCT: 43.4 % (ref 39.0–52.0)
Hemoglobin: 15.8 g/dL (ref 13.0–17.0)
Immature Granulocytes: 1 %
Lymphocytes Relative: 7 %
Lymphs Abs: 0.5 10*3/uL — ABNORMAL LOW (ref 0.7–4.0)
MCH: 33 pg (ref 26.0–34.0)
MCHC: 36.4 g/dL — ABNORMAL HIGH (ref 30.0–36.0)
MCV: 90.6 fL (ref 80.0–100.0)
Monocytes Absolute: 0.4 10*3/uL (ref 0.1–1.0)
Monocytes Relative: 5 %
Neutro Abs: 6.6 10*3/uL (ref 1.7–7.7)
Neutrophils Relative %: 87 %
Platelets: 102 10*3/uL — ABNORMAL LOW (ref 150–400)
RBC: 4.79 MIL/uL (ref 4.22–5.81)
RDW: 13.2 % (ref 11.5–15.5)
WBC: 7.6 10*3/uL (ref 4.0–10.5)
nRBC: 0 % (ref 0.0–0.2)

## 2021-03-13 LAB — BLOOD CULTURE ID PANEL (REFLEXED) - BCID2

## 2021-03-13 LAB — COMPREHENSIVE METABOLIC PANEL
ALT: 20 U/L (ref 0–44)
AST: 25 U/L (ref 15–41)
Albumin: 3 g/dL — ABNORMAL LOW (ref 3.5–5.0)
Alkaline Phosphatase: 53 U/L (ref 38–126)
Anion gap: 11 (ref 5–15)
BUN: 56 mg/dL — ABNORMAL HIGH (ref 8–23)
CO2: 24 mmol/L (ref 22–32)
Calcium: 9.1 mg/dL (ref 8.9–10.3)
Chloride: 107 mmol/L (ref 98–111)
Creatinine, Ser: 1.93 mg/dL — ABNORMAL HIGH (ref 0.61–1.24)
GFR, Estimated: 35 mL/min — ABNORMAL LOW (ref >60)
Glucose, Bld: 130 mg/dL — ABNORMAL HIGH (ref 70–99)
Potassium: 4.4 mmol/L (ref 3.5–5.1)
Sodium: 142 mmol/L (ref 135–145)
Total Bilirubin: 1.3 mg/dL — ABNORMAL HIGH (ref 0.3–1.2)
Total Protein: 6.1 g/dL — ABNORMAL LOW (ref 6.5–8.1)

## 2021-03-13 LAB — CK: Total CK: 371 U/L (ref 49–397)

## 2021-03-13 LAB — FERRITIN: Ferritin: 506 ng/mL — ABNORMAL HIGH (ref 24–336)

## 2021-03-13 LAB — D-DIMER, QUANTITATIVE: D-Dimer, Quant: 0.37 ug/mL-FEU (ref 0.00–0.50)

## 2021-03-13 LAB — PROCALCITONIN: Procalcitonin: 0.11 ng/mL

## 2021-03-13 MED ORDER — GABAPENTIN 100 MG PO CAPS
100.0000 mg | ORAL_CAPSULE | Freq: Once | ORAL | Status: AC
  Administered 2021-03-13: 100 mg via ORAL
  Filled 2021-03-13: qty 1

## 2021-03-13 MED ORDER — PANTOPRAZOLE SODIUM 40 MG PO TBEC
40.0000 mg | DELAYED_RELEASE_TABLET | Freq: Every day | ORAL | Status: DC
  Administered 2021-03-13 – 2021-03-14 (×2): 40 mg via ORAL
  Filled 2021-03-13 (×2): qty 1

## 2021-03-13 NOTE — Progress Notes (Signed)
PROGRESS NOTE  Matthew Aguilar  DOB: 08/01/1941  PCP: No primary care provider on file. AC:[id number]  DOA: 03/10/2021  LOS: 1 day  Hospital Day: 2   Chief Complaint  Patient presents with   Altered Mental Status    Unresponsive     Brief narrative: Matthew Aguilar is a 80 y.o. male with no documented past medical history  7/30, patient's neighbor called EMS for welfare check.  EMS found him on the floor unresponsive, hypoxic in 80s, started on oxygen 4 L by nasal, and brought to ED.    On arrival, patient was able to tell his name and knew he was in the hospital otherwise disoriented.  Reported several days of cough and shortness of breath.  Not vaccinated for COVID-19. COVID PCR positive WBC elevated to 11.7, creatinine elevated to 2.85, CK elevated 746, lactic acid level elevated 2.6 and subsequently to 3.  CT head did not show any acute intracranial abnormality CT of the chest abdomen pelvis without contrast was read as lobulated 12.5x 6 x 9.5 cm fluid density lesion along the right lateral upper abdomen subcutaneus soft tissues suggestive of sebaceous cyst versus liquefied hematoma versus likely benign neoplasm. Patient was admitted to hospitalist service for further evaluation management.  Subjective: Patient was seen and examined this morning.  Pleasant elderly Caucasian male.  Lying on bed.  Not in distress.  Chronic sick looking appearance Hemodynamically stable overnight. Labs this morning creatinine improving to 1.93  Assessment/Plan: Acute metabolic encephalopathy -Found unresponsive on the floor, unknown duration -Likely etiology progressive weakness, hypoxia from COVID infection coupled with low baseline functional status due to dementia. -Continue to monitor mental status change  COVID infection Acute respiratory failure with hypoxia  -Presented with shortness of breath, cough, weakness, hypoxia, unresponsiveness -COVID test: PCR positive in  the ED -Chest imaging: CT chest negative for pneumonia -Treatment: Currently on a course of IV remdesivir and tapering course of IV steroids -Currently on 4 L of oxygen by nasal cannula as tolerated. -Continue supportive treatment with Vitamin C, Zinc, PRN inhalers, Tylenol, Antitussives (benzonatate/ Mucinex/Tussionex).   -WBC and inflammatory markers trend as below.  Recent Labs  Lab 03/10/2021 1755 02/11/2021 1756 02/20/2021 2009 03/04/2021 2250 03/13/21 0715  SARSCOV2NAA POSITIVE*  --   --   --   --   WBC  --  11.7*  --   --  7.6  LATICACIDVEN  --   --  2.6* 3.0*  --   PROCALCITON  --  0.21  --   --  0.11  DDIMER  --   --   --   --  0.37  FERRITIN  --   --   --   --  506*  ALT  --  26  --   --  20   Severe sepsis due to COVID-19 infection - POA Gram-positive bacteremia -Presented with leukocytosis, lactic acidosis, hypoxia, altered mentation -I do not think he has underlying bacterial infection.  The patient is growing staph epidermis in 1 out of 4 blood culture.  Likely contamination. -Okay to monitor off antibiotics for now. -Currently on IV hydration.  Repeat lactic acid level tomorrow.  Rhabdomyolysis -CK level elevated probably because of prolonged downtime. -Improving with IV hydration.  Continue to monitor. Recent Labs  Lab 03/06/2021 1756 03/13/21 0715  CKTOTAL 746* 371   AKI -Baseline creatinine level unavailable, presented with a creatinine elevated 2.85 due to dehydration and rhabdomyolysis.  Creatinine improving with IV fluid.  Continue to monitor. Recent Labs    02/23/2021 1756 03/13/21 0715  BUN 72* 56*  CREATININE 2.85* 1.93*   Right chest wall lobulated fluid-filled cysts-POA -Not sure patient wants this evaluated.  We we will discuss this with him tomorrow.  Mobility: Encourage ambulation.  PT eval ordered Code Status:   Code Status: Full Code  Nutritional status: Body mass index is 18.72 kg/m.     Diet:  Diet Order             Diet Heart Room  service appropriate? Yes; Fluid consistency: Thin  Diet effective now                  DVT prophylaxis:  heparin injection 5,000 Units Start: 03/08/2021 2200 Place TED hose Start: 02/18/2021 2057   Antimicrobials: IV remdesivir Fluid: Normal saline at 75 mill per hour Consultants: None Family Communication: None at bedside  Status is: Inpatient  Remains inpatient appropriate because: Needs IV hydration, inpatient monitoring  Dispo: The patient is from: Home              Anticipated d/c is to: Pending PT eval              Patient currently is not medically stable to d/c.   Difficult to place patient No     Infusions:   remdesivir 100 mg in NS 100 mL 100 mg (03/13/21 1044)    Scheduled Meds:  heparin  5,000 Units Subcutaneous Q8H   methylPREDNISolone (SOLU-MEDROL) injection  60 mg Intravenous Q12H    Antimicrobials: Anti-infectives (From admission, onward)    Start     Dose/Rate Route Frequency Ordered Stop   03/13/21 1000  remdesivir 100 mg in sodium chloride 0.9 % 100 mL IVPB       See Hyperspace for full Linked Orders Report.   100 mg 200 mL/hr over 30 Minutes Intravenous Daily 03/10/2021 1936 03-22-21 0959   02/17/2021 2100  remdesivir 200 mg in sodium chloride 0.9% 250 mL IVPB       See Hyperspace for full Linked Orders Report.   200 mg 580 mL/hr over 30 Minutes Intravenous Once 02/16/2021 1936 03/07/2021 2240       PRN meds: acetaminophen, chlorpheniramine-HYDROcodone, guaiFENesin-dextromethorphan, labetalol, ondansetron **OR** ondansetron (ZOFRAN) IV   Objective: Vitals:   03/13/21 1049 03/13/21 1148  BP:  112/78  Pulse:  63  Resp:  15  Temp:  98.4 F (36.9 C)  SpO2: 98% 98%    Intake/Output Summary (Last 24 hours) at 03/13/2021 1315 Last data filed at 03/13/2021 0100 Gross per 24 hour  Intake 515 ml  Output --  Net 515 ml   Filed Weights   02/28/2021 1755  Weight: 62.6 kg   Weight change:  Body mass index is 18.72 kg/m.   Physical  Exam: General exam: Elderly Caucasian male.  Propped up in bed.  Not in distress Skin: No rashes, lesions or ulcers. HEENT: Atraumatic, normocephalic, no obvious bleeding Lungs: Clear to auscultation bilaterally CVS: Regular rate and rhythm, no murmur GI/Abd soft, nontender, nondistended, bowel sound present CNS: Alert, awake, oriented to place and person Psychiatry: Depressed look Extremities: No pedal edema, no calf tenderness  Data Review: I have personally reviewed the laboratory data and studies available.  Recent Labs  Lab 03/01/2021 1756 03/13/21 0715  WBC 11.7* 7.6  NEUTROABS 9.9* 6.6  HGB 18.3* 15.8  HCT 51.3 43.4  MCV 88.3 90.6  PLT 149* 102*   Recent Labs  Lab 03/11/2021 1756 03/13/21  0715  NA 137 142  K 4.2 4.4  CL 106 107  CO2 17* 24  GLUCOSE 169* 130*  BUN 72* 56*  CREATININE 2.85* 1.93*  CALCIUM 9.7 9.1  MG 3.0*  --     F/u labs ordered Unresulted Labs (From admission, onward)     Start     Ordered   03/14/21 0500  Lactic acid, plasma  Tomorrow morning,   R        03/13/21 0835   03/13/21 0500  CBC with Differential/Platelet  Daily,   STAT      02/13/2021 2059   03/13/21 0500  Comprehensive metabolic panel  Daily,   STAT      03/07/2021 2059   03/13/21 0500  D-dimer, quantitative  Daily,   STAT      02/16/2021 2059   03/13/21 0500  Ferritin  Daily,   STAT      02/19/2021 2059   03/13/21 0500  Procalcitonin  Daily,   STAT      02/17/2021 2111   02/26/2021 1756  Urine Culture  (Undifferentiated presentation (screening labs and basic nursing orders))  ONCE - STAT,   STAT       Question:  Indication  Answer:  Altered mental status (if no other cause identified)   03/01/2021 1755            Signed, Terrilee Croak, MD [facility] 03/13/2021

## 2021-03-13 NOTE — Progress Notes (Signed)
  Date and time results received: 03/13/21 0036    Test: lactic Acid Critical Value: 3  Name of Provider Notified: Prudy Feeler  Orders Received? Or Actions Taken?:  none

## 2021-03-13 NOTE — Evaluation (Signed)
Physical Therapy Evaluation Patient Details Name: Matthew Aguilar MRN: [id number] DOB: 11/17/1940 Today's Date: 03/13/2021   History of Present Illness  Matthew Aguilar is a 80 y.o. male with no prior documented medical history, presents after neighbors apparently made a wellness call and he was found unresponsive.  On arrival found to be Covid (+).  Clinical Impression  No contact information in pt chart, difficult to get much information as he was unable to offer much or answer questions appropriate.  Pt disheveled and unkept, indicates that he doesn't do any walking and that neighbors help him, by appearances it appears he is thriving, diffiuclt to see how he could be living alone.  Will need to further assess when/if he becomes more alert and able to participate.  Pt showed very little strength or functional mobility.  Per mental status/improvement it seems unlikely that he could return home safely and will need rehab and likely a higher level of care going forward.    Follow Up Recommendations SNF;Supervision/Assistance - 24 hour    Equipment Recommendations   (TBD)    Recommendations for Other Services       Precautions / Restrictions Precautions Precautions: Fall Restrictions Weight Bearing Restrictions: No      Mobility  Bed Mobility Overal bed mobility: Needs Assistance Bed Mobility: Supine to Sit;Sit to Supine     Supine to sit: Max assist Sit to supine: Max assist   General bed mobility comments: appeared to make some effort to slide LE toward EOB but stopped effort, heavy assist to get to EOB.  Unable to maintain balance w/o constant assist with slowly leaning more and more forward and unable to make any righting response    Transfers                 General transfer comment: unable/unsafe to attempt  Ambulation/Gait                Stairs            Wheelchair Mobility    Modified Rankin (Stroke Patients Only)        Balance Overall balance assessment: Needs assistance Sitting-balance support: Feet supported;Bilateral upper extremity supported Sitting balance-Leahy Scale: Zero Sitting balance - Comments: Pt leaning forward, ultimately with head nearly between his knees with constant cuing (verbal and tactile) from PT to make any active adjustment       Standing balance comment: inappropriate to even attempt                             Pertinent Vitals/Pain Pain Assessment:  ("my head" could not expound)    Home Living Family/patient expects to be discharged to:: Private residence Living Arrangements: Alone Available Help at Discharge:  (pt with minimal ability to relay PLOF but appears neighbors may check on him with some regularity, apparently they also get his groceries, etc?)   Home Access:  (unable to answer)       Home Equipment:  (appears to deny having AD)      Prior Function           Comments: pt with minimal ability to meaningfully answer, seems to indicate that he doesn't walk at all?     Hand Dominance        Extremity/Trunk Assessment   Upper Extremity Assessment Upper Extremity Assessment: Generalized weakness (could not lift UEs against gravity volitionally,)    Lower Extremity Assessment Lower  Extremity Assessment: Generalized weakness (little AROM despite much cuing, some minimal AAROM effort)       Communication   Communication:  (hiccups t/o the session, very weak voice with limited/partial/incoherent answers)  Cognition Arousal/Alertness: Lethargic Behavior During Therapy: Flat affect Overall Cognitive Status: No family/caregiver present to determine baseline cognitive functioning                                 General Comments: pt struggles to report it is [name], he is at [facility], and in [place], only able to state name, could not name birth year      General Comments General comments (skin integrity,  edema, etc.): Pt able to keep eyes open, attempt to maintain conversation but struggled with meaningful interaction    Exercises     Assessment/Plan    PT Assessment Patient needs continued PT services  PT Problem List Decreased strength;Decreased range of motion;Decreased activity tolerance;Decreased balance;Decreased mobility;Decreased cognition;Decreased coordination;Decreased knowledge of use of DME;Decreased safety awareness       PT Treatment Interventions      PT Goals (Current goals can be found in the Care Plan section)  Acute Rehab PT Goals Patient Stated Goal: unable to state PT Goal Formulation: Patient unable to participate in goal setting Time For Goal Achievement: 03/26/21 Potential to Achieve Goals: Poor    Frequency Min 2X/week   Barriers to discharge        Co-evaluation               AM-PAC PT "6 Clicks" Mobility  Outcome Measure Help needed turning from your back to your side while in a flat bed without using bedrails?: Total Help needed moving from lying on your back to sitting on the side of a flat bed without using bedrails?: Total Help needed moving to and from a bed to a chair (including a wheelchair)?: Total Help needed standing up from a chair using your arms (e.g., wheelchair or bedside chair)?: Total Help needed to walk in hospital room?: Total Help needed climbing 3-5 steps with a railing? : Total 6 Click Score: 6    End of Session   Activity Tolerance: Patient limited by fatigue;Patient limited by lethargy Patient left: with bed alarm set;with call bell/phone within reach Nurse Communication: Mobility status (need for linen change) PT Visit Diagnosis: Muscle weakness (generalized) (M62.81);Difficulty in walking, not elsewhere classified (R26.2)    Time: [id number] PT Time Calculation (min) (ACUTE ONLY): 28 min   Charges:   PT Evaluation $PT Eval Low Complexity: 1 Low          Kreg Shropshire, DPT 03/13/2021, 6:13 PM

## 2021-03-13 NOTE — Progress Notes (Signed)
PHARMACY - PHYSICIAN COMMUNICATION CRITICAL VALUE ALERT - BLOOD CULTURE IDENTIFICATION (BCID)  Matthew Aguilar is an 80 y.o. male who presented to [facility] on 02/11/2021 with a chief complaint of altered mental status.   Assessment: Blood culture growing aerobic gram positive cocci in 1 out of 4 bottles. BCID detected Staphylococcus epi. Patient has remained afebrile, and does not have leukocytosis.   Name of physician (or Provider) Contacted: Dr. Pietro Cassis  Current antibiotics: None  Changes to prescribed antibiotics recommended: Do not recommend initiating antibiotics, as the species is likely a contaminant. Recommendations accepted by provider  Results for orders placed or performed during the hospital encounter of 02/14/2021  Blood Culture ID Panel (Reflexed) (Collected: 02/28/2021  5:56 PM)  Result Value Ref Range   Enterococcus faecalis NOT DETECTED NOT DETECTED   Enterococcus Faecium NOT DETECTED NOT DETECTED   Listeria monocytogenes NOT DETECTED NOT DETECTED   Staphylococcus species DETECTED (A) NOT DETECTED   Staphylococcus aureus (BCID) NOT DETECTED NOT DETECTED   Staphylococcus epidermidis DETECTED (A) NOT DETECTED   Staphylococcus lugdunensis NOT DETECTED NOT DETECTED   Streptococcus species NOT DETECTED NOT DETECTED   Streptococcus agalactiae NOT DETECTED NOT DETECTED   Streptococcus pneumoniae NOT DETECTED NOT DETECTED   Streptococcus pyogenes NOT DETECTED NOT DETECTED   A.calcoaceticus-baumannii NOT DETECTED NOT DETECTED   Bacteroides fragilis NOT DETECTED NOT DETECTED   Enterobacterales NOT DETECTED NOT DETECTED   Enterobacter cloacae complex NOT DETECTED NOT DETECTED   Escherichia coli NOT DETECTED NOT DETECTED   Klebsiella aerogenes NOT DETECTED NOT DETECTED   Klebsiella oxytoca NOT DETECTED NOT DETECTED   Klebsiella pneumoniae NOT DETECTED NOT DETECTED   Proteus species NOT DETECTED NOT DETECTED   Salmonella species NOT DETECTED NOT DETECTED   Serratia  marcescens NOT DETECTED NOT DETECTED   Haemophilus influenzae NOT DETECTED NOT DETECTED   Neisseria meningitidis NOT DETECTED NOT DETECTED   Pseudomonas aeruginosa NOT DETECTED NOT DETECTED   Stenotrophomonas maltophilia NOT DETECTED NOT DETECTED   Candida albicans NOT DETECTED NOT DETECTED   Candida auris NOT DETECTED NOT DETECTED   Candida glabrata NOT DETECTED NOT DETECTED   Candida krusei NOT DETECTED NOT DETECTED   Candida parapsilosis NOT DETECTED NOT DETECTED   Candida tropicalis NOT DETECTED NOT DETECTED   Cryptococcus neoformans/gattii NOT DETECTED NOT DETECTED   Methicillin resistance mecA/C NOT DETECTED NOT DETECTED    Wynelle Cleveland, PharmD Pharmacy Resident  03/13/2021 1:14 PM

## 2021-03-13 NOTE — Plan of Care (Signed)
  Problem: Pain Managment: Goal: General experience of comfort will improve Outcome: Progressing   

## 2021-03-14 DIAGNOSIS — J9601 Acute respiratory failure with hypoxia: Secondary | ICD-10-CM | POA: Diagnosis not present

## 2021-03-14 DIAGNOSIS — E43 Unspecified severe protein-calorie malnutrition: Secondary | ICD-10-CM | POA: Insufficient documentation

## 2021-03-14 LAB — CBC WITH DIFFERENTIAL/PLATELET
Abs Immature Granulocytes: 0.04 10*3/uL (ref 0.00–0.07)
Basophils Absolute: 0 10*3/uL (ref 0.0–0.1)
Basophils Relative: 0 %
Eosinophils Absolute: 0 10*3/uL (ref 0.0–0.5)
Eosinophils Relative: 0 %
HCT: 46.7 % (ref 39.0–52.0)
Hemoglobin: 16.4 g/dL (ref 13.0–17.0)
Immature Granulocytes: 1 %
Lymphocytes Relative: 5 %
Lymphs Abs: 0.3 10*3/uL — ABNORMAL LOW (ref 0.7–4.0)
MCH: 31.2 pg (ref 26.0–34.0)
MCHC: 35.1 g/dL (ref 30.0–36.0)
MCV: 88.8 fL (ref 80.0–100.0)
Monocytes Absolute: 0.1 10*3/uL (ref 0.1–1.0)
Monocytes Relative: 2 %
Neutro Abs: 6.6 10*3/uL (ref 1.7–7.7)
Neutrophils Relative %: 92 %
Platelets: 97 10*3/uL — ABNORMAL LOW (ref 150–400)
RBC: 5.26 MIL/uL (ref 4.22–5.81)
RDW: 13.3 % (ref 11.5–15.5)
WBC: 7.1 10*3/uL (ref 4.0–10.5)
nRBC: 0 % (ref 0.0–0.2)

## 2021-03-14 LAB — CULTURE, BLOOD (SINGLE): Special Requests: ADEQUATE

## 2021-03-14 LAB — COMPREHENSIVE METABOLIC PANEL
ALT: 26 U/L (ref 0–44)
AST: 29 U/L (ref 15–41)
Albumin: 3.3 g/dL — ABNORMAL LOW (ref 3.5–5.0)
Alkaline Phosphatase: 59 U/L (ref 38–126)
Anion gap: 11 (ref 5–15)
BUN: 50 mg/dL — ABNORMAL HIGH (ref 8–23)
CO2: 21 mmol/L — ABNORMAL LOW (ref 22–32)
Calcium: 9.4 mg/dL (ref 8.9–10.3)
Chloride: 113 mmol/L — ABNORMAL HIGH (ref 98–111)
Creatinine, Ser: 1.44 mg/dL — ABNORMAL HIGH (ref 0.61–1.24)
GFR, Estimated: 49 mL/min — ABNORMAL LOW (ref >60)
Glucose, Bld: 150 mg/dL — ABNORMAL HIGH (ref 70–99)
Potassium: 4.5 mmol/L (ref 3.5–5.1)
Sodium: 145 mmol/L (ref 135–145)
Total Bilirubin: 1.5 mg/dL — ABNORMAL HIGH (ref 0.3–1.2)
Total Protein: 6.5 g/dL (ref 6.5–8.1)

## 2021-03-14 LAB — URINE CULTURE: Culture: NO GROWTH

## 2021-03-14 LAB — CK: Total CK: 208 U/L (ref 49–397)

## 2021-03-14 LAB — D-DIMER, QUANTITATIVE: D-Dimer, Quant: 0.46 ug/mL-FEU (ref 0.00–0.50)

## 2021-03-14 LAB — LACTIC ACID, PLASMA: Lactic Acid, Venous: 2 mmol/L (ref 0.5–1.9)

## 2021-03-14 LAB — PROCALCITONIN: Procalcitonin: 0.11 ng/mL

## 2021-03-14 LAB — FERRITIN: Ferritin: 559 ng/mL — ABNORMAL HIGH (ref 24–336)

## 2021-03-14 MED ORDER — ENSURE ENLIVE PO LIQD
237.0000 mL | Freq: Two times a day (BID) | ORAL | Status: DC
  Administered 2021-03-14 (×2): 237 mL via ORAL

## 2021-03-14 NOTE — Progress Notes (Signed)
PROGRESS NOTE  Matthew Aguilar  DOB: 14-May-1941  PCP: No primary care provider on file. [id number]  DOA: 02/13/2021  LOS: 2 days  Hospital Day: 3   Chief Complaint  Patient presents with   Altered Mental Status    Unresponsive     Brief narrative: Matthew Aguilar is a 80 y.o. male with no documented past medical history  7/30, patient's neighbor called EMS for welfare check.  EMS found him on the floor unresponsive, hypoxic in 80s, started on oxygen 4 L by nasal, and brought to ED.    On arrival, patient was able to tell his name and knew he was in the hospital otherwise disoriented.  Reported several days of cough and shortness of breath.  Not vaccinated for COVID-19. COVID PCR positive WBC elevated to 11.7, creatinine elevated to 2.85, CK elevated 746, lactic acid level elevated 2.6 and subsequently to 3.  CT head did not show any acute intracranial abnormality CT of the chest abdomen pelvis without contrast was read as lobulated 12.5x 6 x 9.5 cm fluid density lesion along the right lateral upper abdomen subcutaneus soft tissues suggestive of sebaceous cyst versus liquefied hematoma versus likely benign neoplasm. Patient was admitted to hospitalist service for further evaluation management.  Subjective: Patient was seen and examined this morning.   Looks very lethargic.  Opens eyes on verbal command but unable to have any conversation.   Breakfast remains on test at bedside.  Assessment/Plan: Acute metabolic encephalopathy -Found unresponsive on the floor, unknown duration -Likely etiology progressive weakness, hypoxia from COVID infection coupled with low baseline functional status due to dementia. -Remains very lethargic this morning.  Continue to monitor mental status change  COVID infection Acute respiratory failure with hypoxia  -Presented with shortness of breath, cough, weakness, hypoxia, unresponsiveness -COVID test: PCR positive in the  ED -Chest imaging: CT chest negative for pneumonia -Treatment: Currently on a course of IV remdesivir and tapering course of IV steroids -Initially required 4 L of oxygen by nasal cannula.  Gradually weaned down.  Currently on room air. -Continue supportive treatment with Vitamin C, Zinc, PRN inhalers, Tylenol, Antitussives (benzonatate/ Mucinex/Tussionex).   -WBC and inflammatory markers trend as below. Recent Labs  Lab 02/14/2021 1755 03/06/2021 1756 02/13/2021 2009 03/01/2021 2250 03/13/21 0715 03/14/21 0510  SARSCOV2NAA POSITIVE*  --   --   --   --   --   WBC  --  11.7*  --   --  7.6 7.1  LATICACIDVEN  --   --  2.6* 3.0*  --  2.0*  PROCALCITON  --  0.21  --   --  0.11 0.11  DDIMER  --   --   --   --  0.37 0.46  FERRITIN  --   --   --   --  506* 559*  ALT  --  26  --   --  20 26    Severe sepsis due to COVID-19 infection - POA Gram-positive bacteremia -Presented with leukocytosis, lactic acidosis, hypoxia, altered mentation -I do not think he has underlying bacterial infection.  The patient is growing staph epidermis in 1 out of 4 blood culture.  Likely contamination. -Okay to monitor off antibiotics for now. -Currently on IV hydration.  Repeat lactic acid level tomorrow.  Rhabdomyolysis -CK level elevated probably because of prolonged downtime. -Improving with IV hydration.  Continue to monitor. Recent Labs  Lab 03/07/2021 1756 03/13/21 0715 03/14/21 0510  CKTOTAL 746* 371 208  AKI -Baseline creatinine level unavailable, presented with a creatinine elevated 2.85 due to dehydration and rhabdomyolysis.  Creatinine improving with IV fluid.  Continue to monitor. Recent Labs    02/28/2021 1756 03/13/21 0715 03/14/21 0510  BUN 72* 56* 50*  CREATININE 2.85* 1.93* 1.44*    Right chest wall lobulated fluid-filled cysts-POA -Not sure patient wants this evaluated.  Unable to discuss in details of this with the patient.  Reviewed poor mental status  Poor prognosis -Remains full  code at this time.  No next of kin or power of attorney available.  Remains full code.  Palliative care consultation called.  Mobility: Encourage ambulation.  PT eval ordered Code Status:   Code Status: Full Code  Nutritional status: Body mass index is 18.72 kg/m. Nutrition Problem: Severe Malnutrition Etiology: social / environmental circumstances Signs/Symptoms: severe fat depletion, severe muscle depletion Diet:  Diet Order             Diet regular Room service appropriate? Yes; Fluid consistency: Thin  Diet effective now                  DVT prophylaxis:  heparin injection 5,000 Units Start: 03/11/2021 2200 Place TED hose Start: 03/13/2021 2057   Antimicrobials: IV remdesivir Fluid: Normal saline at 75 mill per hour Consultants: None Family Communication: None at bedside  Status is: Inpatient  Remains inpatient appropriate because: Needs IV hydration, inpatient monitoring  Dispo: The patient is from: Home              Anticipated d/c is to: Pending PT eval              Patient currently is not medically stable to d/c.   Difficult to place patient No     Infusions:   remdesivir 100 mg in NS 100 mL 100 mg (03/14/21 1035)    Scheduled Meds:  feeding supplement  237 mL Oral BID BM   heparin  5,000 Units Subcutaneous Q8H   methylPREDNISolone (SOLU-MEDROL) injection  60 mg Intravenous Q12H   pantoprazole  40 mg Oral Daily    Antimicrobials: Anti-infectives (From admission, onward)    Start     Dose/Rate Route Frequency Ordered Stop   03/13/21 1000  remdesivir 100 mg in sodium chloride 0.9 % 100 mL IVPB       See Hyperspace for full Linked Orders Report.   100 mg 200 mL/hr over 30 Minutes Intravenous Daily 03/11/2021 1936 2021-04-15 0959   02/15/2021 2100  remdesivir 200 mg in sodium chloride 0.9% 250 mL IVPB       See Hyperspace for full Linked Orders Report.   200 mg 580 mL/hr over 30 Minutes Intravenous Once 03/09/2021 1936 03/13/2021 2240       PRN  meds: acetaminophen, chlorpheniramine-HYDROcodone, guaiFENesin-dextromethorphan, labetalol, ondansetron **OR** ondansetron (ZOFRAN) IV   Objective: Vitals:   03/14/21 0804 03/14/21 1157  BP: 137/83 124/77  Pulse: 74 62  Resp: 18 18  Temp: 98 F (36.7 C) 97.6 F (36.4 C)  SpO2: 98% 91%    Intake/Output Summary (Last 24 hours) at 03/14/2021 1308 Last data filed at 03/14/2021 0421 Gross per 24 hour  Intake --  Output 2500 ml  Net -2500 ml    Filed Weights   02/16/2021 1755  Weight: 62.6 kg   Weight change:  Body mass index is 18.72 kg/m.   Physical Exam: General exam: Elderly Caucasian male.  Propped up in bed.  Not in distress Skin: No rashes, lesions or ulcers. HEENT:  Atraumatic, normocephalic, no obvious bleeding Lungs: Clear to auscultation bilaterally CVS: Regular rate and rhythm, no murmur GI/Abd soft, nontender, nondistended, bowel sound present CNS: Looks lethargic, unable to have a conversation today  psychiatry: Depressed look Extremities: No pedal edema, no calf tenderness  Data Review: I have personally reviewed the laboratory data and studies available.  Recent Labs  Lab 02/20/2021 1756 03/13/21 0715 03/14/21 0510  WBC 11.7* 7.6 7.1  NEUTROABS 9.9* 6.6 6.6  HGB 18.3* 15.8 16.4  HCT 51.3 43.4 46.7  MCV 88.3 90.6 88.8  PLT 149* 102* 97*    Recent Labs  Lab 02/14/2021 1756 03/13/21 0715 03/14/21 0510  NA 137 142 145  K 4.2 4.4 4.5  CL 106 107 113*  CO2 17* 24 21*  GLUCOSE 169* 130* 150*  BUN 72* 56* 50*  CREATININE 2.85* 1.93* 1.44*  CALCIUM 9.7 9.1 9.4  MG 3.0*  --   --      F/u labs ordered Unresulted Labs (From admission, onward)     Start     Ordered   03/13/21 0500  CBC with Differential/Platelet  Daily,   STAT      03/11/2021 2059   03/13/21 0500  Comprehensive metabolic panel  Daily,   STAT      02/20/2021 2059   03/13/21 0500  D-dimer, quantitative  Daily,   STAT      03/07/2021 2059   03/13/21 0500  Ferritin  Daily,   STAT       03/11/2021 2059            Signed, Terrilee Croak, MD [facility] 03/14/2021

## 2021-03-14 NOTE — Progress Notes (Signed)
Initial Nutrition Assessment  DOCUMENTATION CODES:  Severe malnutrition in context of social or environmental circumstances  INTERVENTION:  Liberalize diet to regular Ensure Enlive po BID, each supplement provides 350 kcal and 20 grams of protein Magic cup TID with meals, each supplement provides 290 kcal and 9 grams of protein  NUTRITION DIAGNOSIS:  Severe Malnutrition related to social / environmental circumstances as evidenced by severe fat depletion, severe muscle depletion.  GOAL:  Patient will meet greater than or equal to 90% of their needs  MONITOR:  PO intake, Supplement acceptance, Labs, Weight trends  REASON FOR ASSESSMENT:  Malnutrition Screening Tool    ASSESSMENT:  80 y.o. male presented to ED from home after he was found on the floor, unresponsive by a neighbor. EMS found pt to be hypoxic in the mid 80s on room air. Found to be positive for COVID19 in ED. No PMH available for pt.  Pt resting in bed at the time of visit. Would open eyes when spoken to but did not verbalize any answers to nutrition interview questions. Did shake head when asked if he was hungry. Otherwise, pt mostly grunted or made no acknowledgment. RN reports that pt has consumed 0% of all meals the last two days she has be caring for him. Breakfast tray noted at bedside untouched.   Upon physical exam, pt is significantly depleted in both fat and muscle stores indicating prolonged undernourishment. Also has a general unkept appearance. RN reports that pt lives alone, no family involved but does have a neighbor who checks in on him.   Attempted to discuss the importance of increasing oral intake with pt due to his increased needs with COVID19 infection, but pt did not seem receptive. Pt would likely benefit from a discussion about [place] with palliative care. Discussed with MD.   Nutritionally Relevant Medications: Scheduled Meds:  methylPREDNISolone injection  60 mg Intravenous Q12H   pantoprazole  40  mg Oral Daily   Continuous Infusions:  remdesivir 100 mg in NS 100 mL 100 mg (03/13/21 1044)   PRN Meds: ondansetron  Labs Reviewed: BUN 50, creatinine 1.44 Ferritin 559 Lactic acid 2.0  NUTRITION - FOCUSED PHYSICAL EXAM: Flowsheet Row Most Recent Value  Orbital Region Severe depletion  Upper Arm Region Severe depletion  Thoracic and Lumbar Region Severe depletion  Buccal Region Severe depletion  Temple Region Severe depletion  Clavicle Bone Region Severe depletion  Clavicle and Acromion Bone Region Severe depletion  Scapular Bone Region Severe depletion  Dorsal Hand Severe depletion  Patellar Region Severe depletion  Anterior Thigh Region Severe depletion  Posterior Calf Region Severe depletion  Edema (RD Assessment) None  Hair Reviewed  Eyes Reviewed  [crust noted around eyelids]  Mouth Unable to assess  Skin Reviewed  [bruising]  Nails Reviewed  [thin, long, dirty]   Diet Order:   Diet Order             Diet regular Room service appropriate? Yes; Fluid consistency: Thin  Diet effective now                   EDUCATION NEEDS:  Education needs have been addressed  Skin:  Skin Assessment: Skin Integrity Issues: Skin Integrity Issues:: Stage I Stage I: coccyx  Last BM:  PTA  Height:  Ht Readings from Last 1 Encounters:  03/08/2021 6' (1.829 m)   Weight:  Wt Readings from Last 1 Encounters:  03/03/2021 62.6 kg    Ideal Body Weight:  80.9 kg  BMI:  Body mass index is 18.72 kg/m.  Estimated Nutritional Needs:  Kcal:  1900-2100 kcal/d Protein:  80-90 g/d Fluid:  2L/d  Ranell Patrick, RD, LDN Clinical Dietitian Pager on [place]

## 2021-03-14 NOTE — Progress Notes (Signed)
Pt neighbor came in to visit and speak about patient post hospital information, contact provided to [place], RN case Barista contact is as followed.  [name] [phone] (cell) [phone] (home)

## 2021-03-14 DEATH — deceased

## 2021-03-15 DIAGNOSIS — J9601 Acute respiratory failure with hypoxia: Secondary | ICD-10-CM | POA: Diagnosis not present

## 2021-03-15 LAB — COMPREHENSIVE METABOLIC PANEL
ALT: 33 U/L (ref 0–44)
AST: 30 U/L (ref 15–41)
Albumin: 3.5 g/dL (ref 3.5–5.0)
Alkaline Phosphatase: 63 U/L (ref 38–126)
Anion gap: 12 (ref 5–15)
BUN: 50 mg/dL — ABNORMAL HIGH (ref 8–23)
CO2: 19 mmol/L — ABNORMAL LOW (ref 22–32)
Calcium: 9.6 mg/dL (ref 8.9–10.3)
Chloride: 116 mmol/L — ABNORMAL HIGH (ref 98–111)
Creatinine, Ser: 1.4 mg/dL — ABNORMAL HIGH (ref 0.61–1.24)
GFR, Estimated: 51 mL/min — ABNORMAL LOW (ref >60)
Glucose, Bld: 181 mg/dL — ABNORMAL HIGH (ref 70–99)
Potassium: 4.5 mmol/L (ref 3.5–5.1)
Sodium: 147 mmol/L — ABNORMAL HIGH (ref 135–145)
Total Bilirubin: 1.5 mg/dL — ABNORMAL HIGH (ref 0.3–1.2)
Total Protein: 6.9 g/dL (ref 6.5–8.1)

## 2021-03-15 LAB — CBC WITH DIFFERENTIAL/PLATELET
Abs Immature Granulocytes: 0.03 10*3/uL (ref 0.00–0.07)
Basophils Absolute: 0 10*3/uL (ref 0.0–0.1)
Basophils Relative: 0 %
Eosinophils Absolute: 0 10*3/uL (ref 0.0–0.5)
Eosinophils Relative: 0 %
HCT: 48.3 % (ref 39.0–52.0)
Hemoglobin: 16.9 g/dL (ref 13.0–17.0)
Immature Granulocytes: 0 %
Lymphocytes Relative: 4 %
Lymphs Abs: 0.3 10*3/uL — ABNORMAL LOW (ref 0.7–4.0)
MCH: 31.8 pg (ref 26.0–34.0)
MCHC: 35 g/dL (ref 30.0–36.0)
MCV: 91 fL (ref 80.0–100.0)
Monocytes Absolute: 0.2 10*3/uL (ref 0.1–1.0)
Monocytes Relative: 2 %
Neutro Abs: 7.9 10*3/uL — ABNORMAL HIGH (ref 1.7–7.7)
Neutrophils Relative %: 94 %
Platelets: 88 10*3/uL — ABNORMAL LOW (ref 150–400)
RBC: 5.31 MIL/uL (ref 4.22–5.81)
RDW: 13.5 % (ref 11.5–15.5)
WBC: 8.4 10*3/uL (ref 4.0–10.5)
nRBC: 0 % (ref 0.0–0.2)

## 2021-03-15 LAB — D-DIMER, QUANTITATIVE: D-Dimer, Quant: <0.27 ug/mL-FEU (ref 0.00–0.50)

## 2021-03-15 LAB — FERRITIN: Ferritin: 616 ng/mL — ABNORMAL HIGH (ref 24–336)

## 2021-03-15 MED ORDER — DEXTROSE-NACL 5-0.45 % IV SOLN: INTRAVENOUS | Status: DC

## 2021-03-15 NOTE — Progress Notes (Signed)
PROGRESS NOTE  Matthew Aguilar [name]  DOB: Apr 12, 1941  PCP: No primary care provider on file. [id number]  DOA: 02/11/2021  LOS: 3 days  Hospital Day: 4   Chief Complaint  Patient presents with   Altered Mental Status    Unresponsive     Brief narrative: Matthew Aguilar is a 80 y.o. male with no documented past medical history  7/30, patient's neighbor called EMS for welfare check.  EMS found him on the floor unresponsive, hypoxic in 80s, started on oxygen 4 L by nasal, and brought to ED.    On arrival, patient was able to tell his name and knew he was in the hospital otherwise disoriented.  Reported several days of cough and shortness of breath.  Not vaccinated for COVID-19. COVID PCR positive WBC elevated to 11.7, creatinine elevated to 2.85, CK elevated 746, lactic acid level elevated 2.6 and subsequently to 3.  CT head did not show any acute intracranial abnormality CT of the chest abdomen pelvis without contrast was read as lobulated 12.5x 6 x 9.5 cm fluid density lesion along the right lateral upper abdomen subcutaneus soft tissues suggestive of sebaceous cyst versus liquefied hematoma versus likely benign neoplasm. Patient was admitted to hospitalist service for further evaluation management.  Subjective: Patient was seen and examined this morning.  Lying on bed.  Looks lethargic.  Able to open eyes on verbal command.  Unable to have a conversation.  Not eating or drinking.  Palliative care involved.  Assessment/Plan: Acute metabolic encephalopathy -Brought to the ED after found unresponsive on the floor, unknown duration -Likely etiology progressive weakness, hypoxia from COVID infection coupled with low baseline functional status due to dementia. -He has remained lethargic since admission.  Continue monitor  COVID infection Acute respiratory failure with hypoxia  -Presented with shortness of breath, cough, weakness, hypoxia, unresponsiveness -COVID test:  PCR positive in the ED -Chest imaging: CT chest negative for pneumonia -Treatment: Currently on a course of IV remdesivir and tapering course of IV steroids -Initially required 4 L of oxygen by nasal cannula.  Gradually weaned down.  Currently on room air. -Continue supportive treatment with Vitamin C, Zinc, PRN inhalers, Tylenol, Antitussives (benzonatate/ Mucinex/Tussionex).   -WBC and inflammatory markers trend as below. Recent Labs  Lab 02/13/2021 1755 03/11/2021 1756 02/20/2021 2009 02/13/2021 2250 03/13/21 0715 03/14/21 0510 03/15/21 0440  SARSCOV2NAA POSITIVE*  --   --   --   --   --   --   WBC  --  11.7*  --   --  7.6 7.1 8.4  LATICACIDVEN  --   --  2.6* 3.0*  --  2.0*  --   PROCALCITON  --  0.21  --   --  0.11 0.11  --   DDIMER  --   --   --   --  0.37 0.46 <0.27  FERRITIN  --   --   --   --  506* 559* 616*  ALT  --  26  --   --  20 26 33    Severe sepsis due to COVID-19 infection - POA Gram-positive bacteremia -Presented with leukocytosis, lactic acidosis, hypoxia, altered mentation -I do not think he has underlying bacterial infection.  The patient is growing staph epidermis in 1 out of 4 blood culture.  Likely contamination. -Okay to monitor off antibiotics for now. -Currently on IV hydration.  Rhabdomyolysis -CK level elevated probably because of prolonged downtime. -Improving with IV hydration.  Recent Labs  Lab 03/02/2021 1756 03/13/21 0715 03/14/21 0510  CKTOTAL 746* 371 208    AKI -Baseline creatinine level unavailable, presented with a creatinine elevated to 2.85 due to dehydration and rhabdomyolysis. Creatinine is improving with IV fluid.  Continue to monitor. Recent Labs    02/21/2021 1756 03/13/21 0715 03/14/21 0510 03/15/21 0440  BUN 72* 56* 50* 50*  CREATININE 2.85* 1.93* 1.44* 1.40*    Right chest wall lobulated fluid-filled cysts-POA -Not sure patient wants this evaluated.  Unable to discuss in details of this with the patient.  Because of his poor  mental status  Poor prognosis -Remains full code at this time.  No next of kin or power of attorney available. Remains full code.  Palliative care consultation called.  Code Status:   Code Status: Full Code at this time Nutritional status: Body mass index is 18.72 kg/m. Nutrition Problem: Severe Malnutrition Etiology: social / environmental circumstances Signs/Symptoms: severe fat depletion, severe muscle depletion Diet:  Diet Order             Diet regular Room service appropriate? Yes; Fluid consistency: Thin  Diet effective now                  DVT prophylaxis:  heparin injection 5,000 Units Start: 02/13/2021 2200 Place TED hose Start: 02/19/2021 2057   Antimicrobials: IV remdesivir Fluid: Normal saline at 75 mill per hour Consultants: None Family Communication: None at bedside.  Tried to reach the neighbor and the contact information given.  Unable.  Status is: Inpatient  Remains inpatient appropriate because: Needs IV hydration, inpatient monitoring.  Poor prognosis  Dispo: The patient is from: Home              Anticipated d/c is to: Poor prognosis.  No clear legal power of attorney.  Palliative care involved.              Patient currently is not medically stable to d/c.   Difficult to place patient No   Infusions:   dextrose 5 % and 0.45% NaCl 75 mL/hr at 03/15/21 1013   remdesivir 100 mg in NS 100 mL 100 mg (03/15/21 0939)    Scheduled Meds:  feeding supplement  237 mL Oral BID BM   heparin  5,000 Units Subcutaneous Q8H    Antimicrobials: Anti-infectives (From admission, onward)    Start     Dose/Rate Route Frequency Ordered Stop   03/13/21 1000  remdesivir 100 mg in sodium chloride 0.9 % 100 mL IVPB       See Hyperspace for full Linked Orders Report.   100 mg 200 mL/hr over 30 Minutes Intravenous Daily 02/13/2021 1936 04-04-2021 0959   02/28/2021 2100  remdesivir 200 mg in sodium chloride 0.9% 250 mL IVPB       See Hyperspace for full Linked Orders  Report.   200 mg 580 mL/hr over 30 Minutes Intravenous Once 02/21/2021 1936 02/12/2021 2240       PRN meds: acetaminophen, chlorpheniramine-HYDROcodone, guaiFENesin-dextromethorphan, ondansetron **OR** ondansetron (ZOFRAN) IV   Objective: Vitals:   03/15/21 0849 03/15/21 1152  BP: 113/86 132/89  Pulse: 66 75  Resp: 16 16  Temp: (!) 97.4 F (36.3 C) (!) 97.4 F (36.3 C)  SpO2: 97% 100%    Intake/Output Summary (Last 24 hours) at 03/15/2021 1410 Last data filed at 03/15/2021 [id number] Gross per 24 hour  Intake --  Output 350 ml  Net -350 ml    Filed Weights   03/11/2021 1755  Weight: 62.6 kg  Weight change:  Body mass index is 18.72 kg/m.   Physical Exam: General exam: Elderly Caucasian male.  Propped up in bed.  Looks lethargic. Skin: No rashes, lesions or ulcers. HEENT: Atraumatic, normocephalic, no obvious bleeding Lungs: Clear to auscultation bilaterally. CVS: Regular rate and rhythm, no murmur GI/Abd soft, nontender, nondistended, bowel sound present CNS: Looks lethargic, unable to have a conversation  psychiatry: Depressed look Extremities: No pedal edema, no calf tenderness  Data Review: I have personally reviewed the laboratory data and studies available.  Recent Labs  Lab 02/14/2021 1756 03/13/21 0715 03/14/21 0510 03/15/21 0440  WBC 11.7* 7.6 7.1 8.4  NEUTROABS 9.9* 6.6 6.6 7.9*  HGB 18.3* 15.8 16.4 16.9  HCT 51.3 43.4 46.7 48.3  MCV 88.3 90.6 88.8 91.0  PLT 149* 102* 97* 88*    Recent Labs  Lab 02/22/2021 1756 03/13/21 0715 03/14/21 0510 03/15/21 0440  NA 137 142 145 147*  K 4.2 4.4 4.5 4.5  CL 106 107 113* 116*  CO2 17* 24 21* 19*  GLUCOSE 169* 130* 150* 181*  BUN 72* 56* 50* 50*  CREATININE 2.85* 1.93* 1.44* 1.40*  CALCIUM 9.7 9.1 9.4 9.6  MG 3.0*  --   --   --      F/u labs ordered Unresulted Labs (From admission, onward)     Start     Ordered   03/13/21 0500  CBC with Differential/Platelet  Daily,   STAT      03/04/2021 2059   03/13/21  0500  Comprehensive metabolic panel  Daily,   STAT      03/10/2021 2059   03/13/21 0500  D-dimer, quantitative  Daily,   STAT      03/04/2021 2059   03/13/21 0500  Ferritin  Daily,   STAT      03/04/2021 2059            Signed, Terrilee Croak, MD [facility] 03/15/2021

## 2021-03-15 NOTE — TOC Progression Note (Signed)
Transition of Care [facility]) - Progression Note    Patient Details  Name: Matthew Aguilar MRN: [id number] Date of Birth: 12-11-1940  Transition of Care [facility]) CM/SW [place], RN Phone Number: 03/15/2021, 2:34 PM  Clinical Narrative:   Patient very lethargic.  Message left for neighbor, awaiting return call.         Expected Discharge Plan and Services                                                 Social Determinants of Health (SDOH) Interventions    Readmission Risk Interventions No flowsheet data found.

## 2021-03-15 NOTE — Progress Notes (Signed)
D/C tele as MD order

## 2021-03-15 NOTE — Plan of Care (Addendum)
PMT note:  Spoke with nurse and per report, he is very lethargic only opening eyes briefly if you speak to him. Very poor PO intake. Patient is currently on covid isolation. Staff is unaware of any family or HPOA. Attempted to contact the neighbor unsuccessfully.  Technically/legally, the neighbor could be his surrogate decision maker if he chose to, but their relationship is unknown.  Would have a very low threshold to reach out to APS to start the process for guardianship. Will be glad to have a [place] conversation when a surrogate decision maker is established, or if patient becomes oriented to make decisions himself.

## 2021-03-15 NOTE — Progress Notes (Signed)
PT Cancellation Note  Patient Details Name: Matthew Aguilar MRN: [id number] DOB: 1941-02-13   Cancelled Treatment:     Pt remains very lethargic, eyes closed, poor po intake.  Spoke with Nurse/MD who suggested to hold PT session today due to pt's poor status.    [name] 03/15/2021, 11:48 AM

## 2021-03-16 ENCOUNTER — Inpatient Hospital Stay: Payer: Medicare Other

## 2021-03-16 DIAGNOSIS — N179 Acute kidney failure, unspecified: Secondary | ICD-10-CM | POA: Diagnosis not present

## 2021-03-16 DIAGNOSIS — N184 Chronic kidney disease, stage 4 (severe): Secondary | ICD-10-CM

## 2021-03-16 DIAGNOSIS — Z7189 Other specified counseling: Secondary | ICD-10-CM

## 2021-03-16 DIAGNOSIS — L729 Follicular cyst of the skin and subcutaneous tissue, unspecified: Secondary | ICD-10-CM

## 2021-03-16 DIAGNOSIS — J9601 Acute respiratory failure with hypoxia: Secondary | ICD-10-CM | POA: Diagnosis not present

## 2021-03-16 LAB — CBC WITH DIFFERENTIAL/PLATELET
Abs Immature Granulocytes: 0.05 10*3/uL (ref 0.00–0.07)
Basophils Absolute: 0 10*3/uL (ref 0.0–0.1)
Basophils Relative: 0 %
Eosinophils Absolute: 0 10*3/uL (ref 0.0–0.5)
Eosinophils Relative: 0 %
HCT: 50 % (ref 39.0–52.0)
Hemoglobin: 17 g/dL (ref 13.0–17.0)
Immature Granulocytes: 0 %
Lymphocytes Relative: 11 %
Lymphs Abs: 1.3 10*3/uL (ref 0.7–4.0)
MCH: 31.1 pg (ref 26.0–34.0)
MCHC: 34 g/dL (ref 30.0–36.0)
MCV: 91.4 fL (ref 80.0–100.0)
Monocytes Absolute: 0.4 10*3/uL (ref 0.1–1.0)
Monocytes Relative: 4 %
Neutro Abs: 9.7 10*3/uL — ABNORMAL HIGH (ref 1.7–7.7)
Neutrophils Relative %: 85 %
Platelets: 80 10*3/uL — ABNORMAL LOW (ref 150–400)
RBC: 5.47 MIL/uL (ref 4.22–5.81)
RDW: 13.4 % (ref 11.5–15.5)
WBC: 11.5 10*3/uL — ABNORMAL HIGH (ref 4.0–10.5)
nRBC: 0 % (ref 0.0–0.2)

## 2021-03-16 LAB — COMPREHENSIVE METABOLIC PANEL
ALT: 33 U/L (ref 0–44)
AST: 26 U/L (ref 15–41)
Albumin: 3.2 g/dL — ABNORMAL LOW (ref 3.5–5.0)
Alkaline Phosphatase: 67 U/L (ref 38–126)
Anion gap: 8 (ref 5–15)
BUN: 44 mg/dL — ABNORMAL HIGH (ref 8–23)
CO2: 24 mmol/L (ref 22–32)
Calcium: 9.5 mg/dL (ref 8.9–10.3)
Chloride: 116 mmol/L — ABNORMAL HIGH (ref 98–111)
Creatinine, Ser: 1.38 mg/dL — ABNORMAL HIGH (ref 0.61–1.24)
GFR, Estimated: 52 mL/min — ABNORMAL LOW (ref >60)
Glucose, Bld: 155 mg/dL — ABNORMAL HIGH (ref 70–99)
Potassium: 3.7 mmol/L (ref 3.5–5.1)
Sodium: 148 mmol/L — ABNORMAL HIGH (ref 135–145)
Total Bilirubin: 1 mg/dL (ref 0.3–1.2)
Total Protein: 6.7 g/dL (ref 6.5–8.1)

## 2021-03-16 LAB — BLOOD GAS, ARTERIAL
Acid-base deficit: 3.5 mmol/L — ABNORMAL HIGH (ref 0.0–2.0)
Bicarbonate: 19.5 mmol/L — ABNORMAL LOW (ref 20.0–28.0)
O2 Saturation: 94.6 %
Patient temperature: 37
pCO2 arterial: 30 mmHg — ABNORMAL LOW (ref 32.0–48.0)
pH, Arterial: 7.42 (ref 7.350–7.450)
pO2, Arterial: 72 mmHg — ABNORMAL LOW (ref 83.0–108.0)

## 2021-03-16 LAB — FERRITIN: Ferritin: 628 ng/mL — ABNORMAL HIGH (ref 24–336)

## 2021-03-16 LAB — D-DIMER, QUANTITATIVE: D-Dimer, Quant: 0.32 ug/mL-FEU (ref 0.00–0.50)

## 2021-03-16 MED ORDER — HALOPERIDOL 0.5 MG PO TABS
0.5000 mg | ORAL_TABLET | ORAL | Status: DC | PRN
  Filled 2021-03-16: qty 1

## 2021-03-16 MED ORDER — GLYCOPYRROLATE 0.2 MG/ML IJ SOLN: 0.2000 mg | INTRAMUSCULAR | Status: DC | PRN

## 2021-03-16 MED ORDER — SODIUM CHLORIDE 0.9% FLUSH
3.0000 mL | Freq: Two times a day (BID) | INTRAVENOUS | Status: DC
  Administered 2021-03-16: 21:00:00 3 mL via INTRAVENOUS

## 2021-03-16 MED ORDER — LORAZEPAM 1 MG PO TABS: 1.0000 mg | ORAL_TABLET | ORAL | Status: DC | PRN

## 2021-03-16 MED ORDER — GLYCOPYRROLATE 1 MG PO TABS
1.0000 mg | ORAL_TABLET | ORAL | Status: DC | PRN
  Filled 2021-03-16: qty 1

## 2021-03-16 MED ORDER — HALOPERIDOL LACTATE 2 MG/ML PO CONC
0.5000 mg | ORAL | Status: DC | PRN
  Filled 2021-03-16: qty 0.3

## 2021-03-16 MED ORDER — SODIUM CHLORIDE 0.9 % IV SOLN: 250.0000 mL | INTRAVENOUS | Status: DC | PRN

## 2021-03-16 MED ORDER — LORAZEPAM 2 MG/ML PO CONC: 1.0000 mg | ORAL | Status: DC | PRN

## 2021-03-16 MED ORDER — LORAZEPAM 2 MG/ML IJ SOLN: 1.0000 mg | INTRAMUSCULAR | Status: DC | PRN

## 2021-03-16 MED ORDER — MORPHINE SULFATE (PF) 2 MG/ML IV SOLN
2.0000 mg | INTRAVENOUS | Status: DC | PRN
  Administered 2021-03-17: 02:00:00 2 mg via INTRAVENOUS
  Filled 2021-03-16: qty 1

## 2021-03-16 MED ORDER — HALOPERIDOL LACTATE 5 MG/ML IJ SOLN: 0.5000 mg | INTRAMUSCULAR | Status: DC | PRN

## 2021-03-16 MED ORDER — SODIUM CHLORIDE 0.9% FLUSH: 3.0000 mL | INTRAVENOUS | Status: DC | PRN

## 2021-03-16 MED ORDER — ONDANSETRON 4 MG PO TBDP
4.0000 mg | ORAL_TABLET | Freq: Four times a day (QID) | ORAL | Status: DC | PRN
  Filled 2021-03-16: qty 1

## 2021-03-16 MED ORDER — ONDANSETRON HCL 4 MG/2ML IJ SOLN: 4.0000 mg | Freq: Four times a day (QID) | INTRAMUSCULAR | Status: DC | PRN

## 2021-03-16 NOTE — Consult Note (Addendum)
Consultation Note Date: 03/16/2021   Patient Name: Matthew Aguilar  DOB: [birth year]  MRN: [id number]  Age / Sex: 80 y.o., male  PCP: No primary care provider on file. Referring Physician: Jonetta Osgood, MD  Reason for Consultation: Establishing goals of care  HPI/Patient Profile: Matthew Aguilar is a 80 y.o. male with no documented past medical history 7/30, patient's neighbor called EMS for welfare check.  EMS found him on the floor unresponsive, hypoxic in 80s, started on oxygen 4 L by nasal, and brought to ED.  Clinical Assessment and Goals of Care: Call received by primary MD regarding patient's poor, and currently deteriorating status with regards to implementing the futility policy. Received a call very shortly after, that attending was able to reach the neighbor. Advised that the neighbor would act as surrogate decision maker, and the neighbor would not want to prolong suffering. Request made that I speak with neighbor as well.   Patient is resting in bed at this time. He appears frail and is not responsive to me. Chart reviewed in detail; no family available, only a neighbor. Called multiple times and reached neighbor. Neighbor states patient has no family as they have all died. He states he has known the patient for over 20 years.   He states he has been contacted by the attending MD and TOC. Discussed his status, diagnosis, prognosis, and wishes. Discussed aggressive path vs comfort path. He states he does not believe the patient would want CPR, or ventilator support. He states the patient would want to be kept comfortable for what time he has until death. He understands patient's prognosis is likely limited to hours to days. Attending MD made aware of conversation.      SUMMARY OF RECOMMENDATIONS   Patient's neighbor is his surrogate decision maker as he has no known living family,  and neighbor is willing to do so. He confirms a DNR status and that he would be amenable to comfort focused care for what time patient has left. He does not want patient to suffer. TOC was present during phone call and confirmed the same.    Prognosis:  Hours - Days       Primary Diagnoses: Present on Admission:  Acute hypoxemic respiratory failure (HCC)  CKD (chronic kidney disease), stage IV (HCC)  Cyst of subcutaneous tissue  Elevated CK   I have reviewed the medical record, interviewed the patient and family, and examined the patient. The following aspects are pertinent.  No past medical history on file. Social History   Socioeconomic History   Marital status: Unknown    Spouse name: Not on file   Number of children: Not on file   Years of education: Not on file   Highest education level: Not on file  Occupational History   Not on file  Tobacco Use   Smoking status: Not on file   Smokeless tobacco: Not on file  Substance and Sexual Activity   Alcohol use: Not on file   Drug use:  Not on file   Sexual activity: Not on file  Other Topics Concern   Not on file  Social History Narrative   Not on file   Social Determinants of Health   Financial Resource Strain: Not on file  Food Insecurity: Not on file  Transportation Needs: Not on file  Physical Activity: Not on file  Stress: Not on file  Social Connections: Not on file   No family history on file. Scheduled Meds:  feeding supplement  237 mL Oral BID BM   heparin  5,000 Units Subcutaneous Q8H   Continuous Infusions:  dextrose 5 % and 0.45% NaCl 75 mL/hr at 03/16/21 1057   PRN Meds:.chlorpheniramine-HYDROcodone, guaiFENesin-dextromethorphan, ondansetron **OR** ondansetron (ZOFRAN) IV Medications Prior to Admission:  Prior to Admission medications   Not on File   Not on File Review of Systems  Unable to perform ROS  Physical Exam Constitutional:      Comments: Eyes closed. Not responsive to me.      Vital Signs: BP 114/78   Pulse 89   Temp 98.3 F (36.8 C)   Resp (!) 31   Ht 6' (1.829 m)   Wt 62.6 kg   SpO2 95%   BMI 18.72 kg/m  Pain Scale: PAINAD   Pain Score: 0-No pain   SpO2: SpO2: 95 % O2 Device:SpO2: 95 % O2 Flow Rate: .O2 Flow Rate (L/min): 2 L/min  IO: Intake/output summary:  Intake/Output Summary (Last 24 hours) at 03/16/2021 1459 Last data filed at 03/16/2021 1057 Gross per 24 hour  Intake 1853.45 ml  Output 450 ml  Net 1403.45 ml    LBM:   Baseline Weight: Weight: 62.6 kg Most recent weight: Weight: 62.6 kg       Time In: 2:20 Time Out: 3:30 Time Total: 50 min Greater than 50%  of this time was spent counseling and coordinating care related to the above assessment and plan.  Signed by: Asencion Gowda, NP   Please contact Palliative Medicine Team phone at [phone] for questions and concerns.  For individual provider: See Shea Evans

## 2021-03-16 NOTE — Progress Notes (Signed)
PT Cancellation Note  Patient Details Name: Matthew Aguilar MRN: [id number] DOB: 1940-12-19   Cancelled Treatment:    Reason Eval/Treat Not Completed: Other (comment). Per RN, pt lethargic and planning on calling a rapid response. PT to hold.  Lieutenant Diego PT, DPT 2:26 PM,03/16/21

## 2021-03-16 NOTE — Progress Notes (Addendum)
Around 1335 patient seemed less responsive.  Minimal responsiveness to sternal rub (will open his eyes).  RR 31, uses abdominal muscles, rest of VSS.  Rapid response team was called d/t MEWS score 3.  Dr. Sloan Leiter has been notified and ordered STAT chest Xray and and ABG.  MD and rapid response present in patient's room.    Arterial O2 72.  Dr Sloan Leiter has been notified.  Currently patient on 2L O2 as per MD.  Patient remains in this floor.  Will continue with MEWS per protocol.  Per MD he will try to contact pt's neighbor for possibly changing code status.

## 2021-03-16 NOTE — Plan of Care (Signed)
PMT note:  Patient remains unable to have a [place] conversation himself. Most recent TOC note reviewed. Attempted to reach the neighbor listed unsuccessfully. Would have a low threshold for beginning the guardianship process with APS. Would be happy to have a [place] conversation once a surrogate decision maker is established, or patient is oriented.

## 2021-03-16 NOTE — TOC Progression Note (Signed)
Transition of Care  [facility]) - Progression Note    Patient Details  Name: Matthew Aguilar MRN: [id number] Date of Birth: Dec 28, 1940  Transition of Care [facility]) CM/SW [place], RN Phone Number: 03/16/2021, 8:55 AM  Clinical Narrative:   Patient's neighbor [name] ([place])  [phone] (Mobile)  contacted RNCM, and state that patient has no surviving family that neighbor is aware of.  [name] stated that both of patient's daughters are deceased and his wife/partner is also deceased.  Patient told Mr. Tillman Sers that he has no other surviving family.  Mr. Tillman Sers states that both the EMS staff and members of the hospital staff asked patient if neighbor can be main contact and patient agreed to this.  Mr. Tillman Sers states that in the previous month, patient stated that he experienced increased weakness and verbalized that he did not feel he would recover from his current illness. Patient stated he did not feel he could drive any longer, began using a mobile cart in the grocery store, and required increased assistance in other activities. When he could not reach patient, he asked for a welfare check and that is when EMS transported patient here.   Mr. Tillman Sers has experienced a recent illness for which he is currently receiving treatment.  He stated he has an appointment to attend, but will contact RNCM later today for further discussion.   Mr. Tillman Sers is agreeable to SNF placement for patient and feels that patient would agree to this plan as well  TOC contact information given, TOC to follow to discharge.        Expected Discharge Plan and Services                                                 Social Determinants of Health (SDOH) Interventions    Readmission Risk Interventions No flowsheet data found.

## 2021-03-16 NOTE — Progress Notes (Signed)
Patient is unresponsive. RR 28. Last VSS.  Dr. Sloan Leiter has been notified.  Per MD to order comfort measures and Morphine '2mg'$  IV prn for moderate pain,severe pain , dyspnea.

## 2021-03-16 NOTE — TOC Progression Note (Signed)
Transition of Care [facility]) - Progression Note    Patient Details  Name: Matthew Aguilar MRN: [id number] Date of Birth: 03/07/1941  Transition of Care [facility]) CM/SW [place], RN Phone Number: 03/16/2021, 3:15 PM  Clinical Narrative:   RNCM contacted patient's neighbor, Mr. Matthew Aguilar re: rapid response and patient condition declining.  Mr. Matthew Aguilar spoke with Dr. Sloan Leiter and NP C. Griffin from  palliative care regarding patient condition prior to this call.  Patient's neighbor verified that he is not aware of any family members of patient, as they are deceased, and patient has not communicated to his neighbor that he currently has any decision-makers assigned to his care.  To date, patient's neighbor is the only contact on record.  Patient's condition was discussed, and neighbor stated patient would not want CPR or interventions other than to keep him comfortable at end-of-life.  Mr Matthew Aguilar stated he would want to honor patient's wishes as his condition declines, to the extent he is able to at this time.  Mr. Matthew Aguilar had no further questions for [facility], as he feels that MD and NP have explained patient's condition and states he understands DNR and comfort care at this time.   TOC contact information given, TOC to follow.       Expected Discharge Plan and Services                                                 Social Determinants of Health (SDOH) Interventions    Readmission Risk Interventions No flowsheet data found.

## 2021-03-16 NOTE — Progress Notes (Addendum)
As per PROGRESS NOTE  Matthew Aguilar  DOB: 03/05/1941  PCP: No primary care provider on file. AC:[id number]  DOA: 02/28/2021  LOS: 4 days  Hospital Day: 5   Chief Complaint  Patient presents with   Altered Mental Status    Unresponsive     Brief narrative: Matthew Aguilar is a 80 y.o. male with no documented past medical history-who was found by his neighbor self-EMS was called-patient was hypoxic/unresponsive-requiring 4 L of oxygen-use found to have severe sepsis with acute hypoxic respiratory failure and acute metabolic encephalopathy due to COVID-19 infection.  Significant studies: 7/30 >>CT head: No acute intracranial findings 7/30 >>CT C-spine: No fracture 7/30>> CT T-spine/L-spine: No fracture 7/30 >>CT chest: Bilateral lower lobe subsegmental atelectasis- 7/30 >>CT abd/pelvis: Lobulated 12.5 x 6 x 9.5 cm in rt upper lateral abdomen-sebaceous cyst vs liquefied hematoma  Microbiology: 7/30>> blood culture: Staph epidermidis 7/30>> urine culture: No growth  Subjective: Barely responsive-mumbles to a sternal rub.  Later in the afternoon-mentation worsened-became tachypneic.  Assessment/Plan: Severe sepsis with acute hypoxic respiratory failure and acute metabolic encephalopathy due to COVID-19 pneumonia: Continues to be severely encephalopathic-barely arousable-no oral intake for the past several days.  Plan is to continue supportive care for now-see palliative care discussion below-if he deteriorates-we will transition to full comfort measures.  Staff epidermidis bacteremia: Likely a contamination.  Rhabdomyolysis: Mild-improving with supportive care  AKI: Unclear if he has underlying CKD-but suspect AKI probably hemodynamically mediated in the setting of COVID infection/poor oral intake.  Hypernatremia: Due to poor oral intake-continue supportive care.  Right lower chest wall mass: Probably benign finding-likely chronic-doubt any work-up required this  admission  Palliative care: Extremely poor prognosis-has been basically unresponsive since admission-now with worsening respiration-tachypnea-concern for microaspiration.  Per RN staff-no oral intake for the past several days.  No next of kin per prior documentation-this MD was able to talk with patient's neighbor- Matthew Aguilar -who has known the patient for the past 20 years.  He is willing to serve as a surrogate-Per patient's neighbor-patient would not want life-prolonging measures-is agreeable to a DNR.  I subsequently reached out to our palliative care team-who also spoke with Mr. [name] see their documentation.  At this point-plan is to monitor closely-DNR order placed-if he deteriorates-transition to full comfort measures.  Pressure Injury 02/16/2021 Coccyx Medial Stage 1 -  Intact skin with non-blanchable redness of a localized area usually over a bony prominence. (Active)  03/11/2021 2300  Location: Coccyx  Location Orientation: Medial  Staging: Stage 1 -  Intact skin with non-blanchable redness of a localized area usually over a bony prominence.  Wound Description (Comments):   Present on Admission: Yes        Code Status:   Code Status: DNR at this time Nutritional status: Body mass index is 18.72 kg/m. Nutrition Problem: Severe Malnutrition Etiology: social / environmental circumstances Signs/Symptoms: severe fat depletion, severe muscle depletion Diet:  Diet Order             Diet regular Room service appropriate? Yes; Fluid consistency: Thin  Diet effective now                  DVT prophylaxis:  heparin injection 5,000 Units Start: 02/19/2021 2200 Place TED hose Start: 03/11/2021 2057   Antimicrobials: IV remdesivir Fluid: Normal saline at 75 mill per hour Consultants: None Family Communication: Matthew Aguilar [phone]  Status is: Inpatient  Remains inpatient appropriate because: Needs IV hydration, inpatient monitoring.  Poor  prognosis  Dispo: The patient is from: Home              Anticipated d/c is to: Poor prognosis.  No clear legal power of attorney.  Palliative care involved.              Patient currently is not medically stable to d/c.   Difficult to place patient No   Infusions:   dextrose 5 % and 0.45% NaCl 75 mL/hr at 03/16/21 1057    Scheduled Meds:  feeding supplement  237 mL Oral BID BM   heparin  5,000 Units Subcutaneous Q8H    Antimicrobials: Anti-infectives (From admission, onward)    Start     Dose/Rate Route Frequency Ordered Stop   03/13/21 1000  remdesivir 100 mg in sodium chloride 0.9 % 100 mL IVPB       See Hyperspace for full Linked Orders Report.   100 mg 200 mL/hr over 30 Minutes Intravenous Daily 02/21/2021 1936 03/16/21 1130   02/12/2021 2100  remdesivir 200 mg in sodium chloride 0.9% 250 mL IVPB       See Hyperspace for full Linked Orders Report.   200 mg 580 mL/hr over 30 Minutes Intravenous Once 03/07/2021 1936 02/21/2021 2240       PRN meds: chlorpheniramine-HYDROcodone, guaiFENesin-dextromethorphan, ondansetron **OR** ondansetron (ZOFRAN) IV   Objective: Vitals:   03/16/21 1335 03/16/21 1537  BP: 114/78 111/77  Pulse: 89 93  Resp: (!) 31 (!) 28  Temp: 98.3 F (36.8 C) 98 F (36.7 C)  SpO2: 95% 96%    Intake/Output Summary (Last 24 hours) at 03/16/2021 1555 Last data filed at 03/16/2021 1057 Gross per 24 hour  Intake 1485.74 ml  Output 450 ml  Net 1035.74 ml    Filed Weights   03/06/2021 1755  Weight: 62.6 kg   Weight change:  Body mass index is 18.72 kg/m.   Physical Exam: Gen Exam: Barely responsive-mumbles incoherently with vigorous sternal rub. HEENT:atraumatic, normocephalic Chest: B/L clear to auscultation anteriorly CVS:S1S2 regular Abdomen:soft non tender, non distended Extremities:no edema Neurology: Difficult exam but seems to move all 4 extremities to pain. Skin: no rash   Data Review: I have personally reviewed the laboratory data and  studies available.  Recent Labs  Lab 02/12/2021 1756 03/13/21 0715 03/14/21 0510 03/15/21 0440 03/16/21 0404  WBC 11.7* 7.6 7.1 8.4 11.5*  NEUTROABS 9.9* 6.6 6.6 7.9* 9.7*  HGB 18.3* 15.8 16.4 16.9 17.0  HCT 51.3 43.4 46.7 48.3 50.0  MCV 88.3 90.6 88.8 91.0 91.4  PLT 149* 102* 97* 88* 80*    Recent Labs  Lab 02/11/2021 1756 03/13/21 0715 03/14/21 0510 03/15/21 0440 03/16/21 0404  NA 137 142 145 147* 148*  K 4.2 4.4 4.5 4.5 3.7  CL 106 107 113* 116* 116*  CO2 17* 24 21* 19* 24  GLUCOSE 169* 130* 150* 181* 155*  BUN 72* 56* 50* 50* 44*  CREATININE 2.85* 1.93* 1.44* 1.40* 1.38*  CALCIUM 9.7 9.1 9.4 9.6 9.5  MG 3.0*  --   --   --   --      F/u labs ordered Unresulted Labs (From admission, onward)     Start     Ordered   03/13/21 0500  CBC with Differential/Platelet  Daily,   STAT      02/28/2021 2059   03/13/21 0500  Comprehensive metabolic panel  Daily,   STAT      02/14/2021 2059   03/13/21 0500  D-dimer, quantitative  Daily,  STAT      02/13/2021 2059   03/13/21 0500  Ferritin  Daily,   STAT      03/01/2021 2059            Signed, Oren Binet, MD [facility] 03/16/2021

## 2021-03-16 NOTE — Significant Event (Signed)
Rapid Response Event Note   Reason for Call :  Respiratory failure  Initial Focused Assessment: Per bedside RN- pt found to be more lethargic and tachypneic in the mid 30's- vitals stable on room air.      Interventions: Dr. Sloan Leiter ordered ABG and chest xray.   Plan of Care:  Per MD patient does not look any different than his previous assessments and past shifts- he stated he would try to reach out to the neighbor have possible code status change.   Event Summary:   MD Notified: Dr. Sloan Leiter Call Time:13:44 Arrival Time:13:50 End Time:14:10  Penne Lash, RN

## 2021-04-14 NOTE — Progress Notes (Signed)
PT Cancellation Note  Patient Details Name: Matthew Aguilar MRN: [id number] DOB: 06/09/1941   Cancelled Treatment:    Reason Eval/Treat Not Completed: Other (comment). PT orders cancelled, pt transitioned to comfort care, PT to sign off.  Lieutenant Diego PT, DPT 8:34 AM,2021/04/07

## 2021-04-14 NOTE — Death Summary Note (Addendum)
DEATH SUMMARY   Patient Details  Name: Matthew Aguilar MRN: [id number] DOB: 1941/07/16  Admission/Discharge Information   Admit Date:  2021-03-24  Date of Death: Date of Death: 03/29/21  Time of Death: Time of Death: 0425  Length of Stay: 5  Referring Physician: No primary care provider on file.   Reason(s) for Hospitalization  Severe sepsis Acute hypoxic respiratory failure COVID-19 pneumonia Acute metabolic encephalopathy   Diagnoses  Preliminary cause of death:  Secondary Diagnoses (including complications and co-morbidities):  Principal Problem:   Acute hypoxemic respiratory failure (HCC) Active Problems:   CKD (chronic kidney disease), stage IV (HCC)   Cyst of subcutaneous tissue   Elevated CK   Pressure injury of skin   Protein-calorie malnutrition, severe   Brief Hospital Course (including significant findings, care, treatment, and services provided and events leading to death)  Brief narrative: Matthew Aguilar is a 80 y.o. male with no documented past medical history-who was found by his neighbor self-EMS was called-patient was hypoxic/unresponsive-requiring 4 L of oxygen-use found to have severe sepsis with acute hypoxic respiratory failure and acute metabolic encephalopathy due to COVID-19 infection.   Significant studies: 25-Mar-2023 >>CT head: No acute intracranial findings 03/25/23 >>CT C-spine: No fracture 7/30>> CT T-spine/L-spine: No fracture 03-25-2023 >>CT chest: Bilateral lower lobe subsegmental atelectasis- March 25, 2023 >>CT abd/pelvis: Lobulated 12.5 x 6 x 9.5 cm in rt upper lateral abdomen-sebaceous cyst vs liquefied hematoma   Microbiology:  7/30>> blood culture: Staph epidermidis 7/30>> urine culture: No growth  Hospital course by problem list Severe sepsis with acute hypoxic respiratory failure and acute metabolic encephalopathy due to COVID-19 pneumonia: Remained severely encephalopathic-barely arousable since admission-with no oral intake in spite  of treatment with steroids and Remdesivir.  He continued to slowly deteriorate-on 8/3 he was essentially unresponsive-and became more hypoxic and tachypneic-there was some suspicion for possible aspiration pneumonia as well-his prognosis was felt to be very poor.  Patient has no living family members per his neighbor.  After discussion with palliative care-we were able to get in touch with his neighbor Mr. Gearlean Alf has known the patient for 20 years.  Per Mr. Lawana Pai would not want life-prolonging measures if he was critically ill-after extensive discussion with the palliative care team/Mr. Hillard-patient was made a DNR-as he continued to deteriorate-comfort measures were initiated-patient subsequently passed away on 03-30-2023 AM    Staff epidermidis bacteremia: Likely a contamination.   Rhabdomyolysis: Mild-improved with supportive care.   AKI: Unclear if he has underlying CKD-but suspect AKI probably hemodynamically mediated in the setting of COVID infection/poor oral intake.   Hypernatremia: Due to poor oral intake-managed with supportive care.   Right lower chest wall mass: Probably benign finding-likely chronic-doubt any work-up required this admission   Pressure Injury March 24, 2021 Coccyx Medial Stage 1 -  Intact skin with non-blanchable redness of a localized area usually over a bony prominence. (Active)  03-24-21 2300  Location: Coccyx  Location Orientation: Medial  Staging: Stage 1 -  Intact skin with non-blanchable redness of a localized area usually over a bony prominence.  Wound Description (Comments):   Present on Admission: Yes   Nutrition Status: Nutrition Problem: Severe Malnutrition Etiology: social / environmental circumstances Signs/Symptoms: severe fat depletion, severe muscle depletion Interventions: Ensure Enlive (each supplement provides 350kcal and 20 grams of protein), MVI, Magic cup, Liberalize Diet    Pertinent Labs and Studies  Significant Diagnostic  Studies CT Head Wo Contrast  Result Date: March 24, 2021 CLINICAL DATA:  Fall. Altered mental status. neighbor called  after checking on pt and found him on the floor unresponsive EXAM: CT HEAD WITHOUT CONTRAST CT CERVICAL SPINE WITHOUT CONTRAST CT CHEST, ABDOMEN AND PELVIS WITHOUT CONTRAST CT THORACIC AND LUMBAR SPINE WITHOUT CONTRAST TECHNIQUE: Contiguous axial images were obtained from the base of the skull through the vertex without intravenous contrast. Multidetector CT imaging of the cervical spine was performed without intravenous contrast. Multiplanar CT image reconstructions were also generated. Multidetector CT imaging of the chest, abdomen and pelvis was performed following the standard protocol without IV contrast. Multidetector CT imaging of the thoracic and lumbar spine was performed without contrast. Multiplanar CT image reconstructions were also generated. COMPARISON:  None. FINDINGS: CT HEAD FINDINGS Brain: Cerebral ventricle sizes are concordant with the degree of cerebral volume loss. Patchy and confluent areas of decreased attenuation are noted throughout the deep and periventricular white matter of the cerebral hemispheres bilaterally, compatible with chronic 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 No evidence of large-territorial acute infarction. No parenchymal hemorrhage. No mass lesion. No extra-axial collection. No mass effect or midline shift. No hydrocephalus. Basilar cisterns are patent. Vascular: No hyperdense vessel. Skull: No acute fracture or focal lesion. Sinuses/Orbits: Mucosal thickening of the right frontal sinus. Otherwise the remaining paranasal sinuses and mastoid air cells are clear. The orbits are unremarkable. Other: None. CT CERVICAL FINDINGS Alignment: Grade 1 anterolisthesis of C4 on C5 likely due to degenerative changes. Skull base and vertebrae: Multilevel moderate to severe degenerative changes of the spine with associated multilevel severe  osseous neural foraminal stenosis. No severe osseous central canal stenosis. No acute fracture. No aggressive appearing focal osseous lesion or focal pathologic process. Soft tissues and spinal canal: No prevertebral fluid or swelling. No visible canal hematoma. Upper chest: Unremarkable. Other: Patient is edentulous. CT CHEST: Ports and Devices: None. Lungs/airways: Bilateral lower lobe subsegmental atelectasis. Question bilateral lower lobe reticulations with limited evaluation due to motion artifact. No focal consolidation. Scattered calcified and noncalcified pulmonary micronodules. No pulmonary mass. No pulmonary contusion or laceration. No pneumatocele formation. The central airways are patent. Pleura: No pleural effusion. No pneumothorax. No hemothorax. Lymph Nodes: Limited evaluation for hilar lymphadenopathy on this noncontrast study. No mediastinal or axillary lymphadenopathy. Mediastinum: No pneumomediastinum. The thoracic aorta is normal in caliber. Atherosclerotic plaque. Four-vessel coronary calcifications. The heart is normal in size. No significant pericardial effusion. The esophagus is unremarkable. Possible tiny hiatal hernia. The thyroid is unremarkable. Chest Wall / Breasts: No chest wall mass. Musculoskeletal: No acute displaced rib or sternal fracture. Limited evaluation due to respiratory motion artifact. Visualized portions of bilateral upper extremities grossly unremarkable. CT ABDOMEN / PELVIS: Liver: Not enlarged. No focal lesion. Biliary System: The gallbladder distended with fluid with otherwise no radio-opaque gallstones. No biliary ductal dilatation. Pancreas: Limited evaluation due to respiratory motion artifact. Diffusely atrophic. No focal lesion. Otherwise normal pancreatic contour. No surrounding inflammatory changes. No main pancreatic ductal dilatation. Spleen: Not enlarged. No focal lesion. Adrenal Glands: No nodularity bilaterally. Kidneys: Bilateral renal cortical scarring.  Bilateral nephrolithiasis measuring up to 8 mm on the left and 4 mm on the right. No hydroureteronephrosis bilaterally. Three consecutive calcifications within the left pelvis measuring up to 9 mm likely within the distal left ureter (5:90-93). No right ureterolithiasis. No contour deforming renal mass. The urinary bladder is unremarkable. Bowel: No small or large bowel wall thickening or dilatation. Diffuse colonic diverticulosis. Poorly visualized transverse colon due to motion artifact. The appendix is unremarkable. Mesentery, Omentum, and Peritoneum: No simple free fluid ascites. No pneumoperitoneum. No mesenteric  hematoma identified. No organized fluid collection. Pelvic Organs: The prostate is prominent. Lymph Nodes: No abdominal, pelvic, inguinal lymphadenopathy. Vasculature: No abdominal aorta or iliac aneurysm. Musculoskeletal: There is a lobulated 12.5 x 6 by 9.5 cm fluid density lesion along right lateral upper abdomen subcutaneus soft tissues (2:73, 5:38). Bilateral, right greater than left, fat containing inguinal hernias. No acute pelvic fracture. CT THORACIC SPINE FINDINGS Alignment: Normal. Vertebrae: Multilevel mild degenerative changes of the spine. No acute fracture or focal pathologic process.No severe osseous neural foraminal or central canal stenosis. Paraspinal and other soft tissues: Negative. CT LUMBAR SPINE FINDINGS Segmentation: 5 lumbar type vertebrae. Alignment: Normal. Vertebrae: Age-indeterminate, likely chronic, L1 compression fracture with greater than 20% height loss. Otherwise no definite acute fracture or focal pathologic process. Multilevel mild degenerative changes of the spine with moderate severe facet arthropathy of the lower lumbar spine. No severe osseous neural foraminal or central canal stenosis. Paraspinal and other soft tissues: Negative. IMPRESSION: 1. 2. No acute intracranial abnormality. 3. No acute displaced fracture or traumatic listhesis of the cervical spine. 4.  No acute traumatic injury to the chest, abdomen, or pelvis with limited evaluation on this noncontrast study. Please see below regarding positive non-traumatic findings. 5. No definite acute fracture or traumatic malalignment of the thoracic or lumbar spine. Age-indeterminate, likely chronic, L1 compression fracture with greater than 20% height loss. Correlate with tenderness to palpation to evaluate for an acute component. 6. Some limited evaluation due to respiratory motion artifact. Other imaging findings of potential clinical significance: 1. Three consecutive calcifications within the left pelvis measuring up to 9 mm likely within the distal left ureter. No associated proximal hydroureteronephrosis. Recommend correlation with urinalysis. 2. Bilateral nonobstructive nephrolithiasis measuring up to 8 mm on the left and 4 mm on the right. 3. A lobulated 12.5 x 6 x 9.5 cm fluid density lesion along the right lateral upper abdomen subcutaneus soft tissues is incompletely evaluated on this noncontrast study. Finding could represent a sebaceous cyst versus liquefied hematoma versus likely benign neoplasm. Correlate with physical exam. Correlate with prior cross-sectional imaging to evaluate for stability. 4. Scattered pulmonary micronodules calcified and noncalcified. No follow-up needed if patient is low-risk (and has no known or suspected primary neoplasm). Non-contrast chest CT can be considered in 12 months if patient is high-risk. This recommendation follows the consensus statement: Guidelines for Management of Incidental Pulmonary Nodules Detected on CT Images: From the Fleischner Society 2017; Radiology 2017; 284:228-243. 5. Diffuse colonic diverticulosis with no acute diverticulitis. 6. Prominent prostate 7. Bilateral, right greater than left, fat containing inguinal hernias. 8.  Aortic Atherosclerosis (ICD10-I70.0). Electronically Signed   By: Iven Finn M.D.   On: 03/05/2021 20:28   CT Cervical Spine  Wo Contrast  Result Date: 03/07/2021 CLINICAL DATA:  Fall. Altered mental status. neighbor called after checking on pt and found him on the floor unresponsive EXAM: CT HEAD WITHOUT CONTRAST CT CERVICAL SPINE WITHOUT CONTRAST CT CHEST, ABDOMEN AND PELVIS WITHOUT CONTRAST CT THORACIC AND LUMBAR SPINE WITHOUT CONTRAST TECHNIQUE: Contiguous axial images were obtained from the base of the skull through the vertex without intravenous contrast. Multidetector CT imaging of the cervical spine was performed without intravenous contrast. Multiplanar CT image reconstructions were also generated. Multidetector CT imaging of the chest, abdomen and pelvis was performed following the standard protocol without IV contrast. Multidetector CT imaging of the thoracic and lumbar spine was performed without contrast. Multiplanar CT image reconstructions were also generated. COMPARISON:  None. FINDINGS: CT HEAD FINDINGS Brain: Cerebral  ventricle sizes are concordant with the degree of cerebral volume loss. Patchy and confluent areas of decreased attenuation are noted throughout the deep and periventricular white matter of the cerebral hemispheres bilaterally, compatible with chronic 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 No evidence of large-territorial acute infarction. No parenchymal hemorrhage. No mass lesion. No extra-axial collection. No mass effect or midline shift. No hydrocephalus. Basilar cisterns are patent. Vascular: No hyperdense vessel. Skull: No acute fracture or focal lesion. Sinuses/Orbits: Mucosal thickening of the right frontal sinus. Otherwise the remaining paranasal sinuses and mastoid air cells are clear. The orbits are unremarkable. Other: None. CT CERVICAL FINDINGS Alignment: Grade 1 anterolisthesis of C4 on C5 likely due to degenerative changes. Skull base and vertebrae: Multilevel moderate to severe degenerative changes of the spine with associated multilevel severe osseous neural  foraminal stenosis. No severe osseous central canal stenosis. No acute fracture. No aggressive appearing focal osseous lesion or focal pathologic process. Soft tissues and spinal canal: No prevertebral fluid or swelling. No visible canal hematoma. Upper chest: Unremarkable. Other: Patient is edentulous. CT CHEST: Ports and Devices: None. Lungs/airways: Bilateral lower lobe subsegmental atelectasis. Question bilateral lower lobe reticulations with limited evaluation due to motion artifact. No focal consolidation. Scattered calcified and noncalcified pulmonary micronodules. No pulmonary mass. No pulmonary contusion or laceration. No pneumatocele formation. The central airways are patent. Pleura: No pleural effusion. No pneumothorax. No hemothorax. Lymph Nodes: Limited evaluation for hilar lymphadenopathy on this noncontrast study. No mediastinal or axillary lymphadenopathy. Mediastinum: No pneumomediastinum. The thoracic aorta is normal in caliber. Atherosclerotic plaque. Four-vessel coronary calcifications. The heart is normal in size. No significant pericardial effusion. The esophagus is unremarkable. Possible tiny hiatal hernia. The thyroid is unremarkable. Chest Wall / Breasts: No chest wall mass. Musculoskeletal: No acute displaced rib or sternal fracture. Limited evaluation due to respiratory motion artifact. Visualized portions of bilateral upper extremities grossly unremarkable. CT ABDOMEN / PELVIS: Liver: Not enlarged. No focal lesion. Biliary System: The gallbladder distended with fluid with otherwise no radio-opaque gallstones. No biliary ductal dilatation. Pancreas: Limited evaluation due to respiratory motion artifact. Diffusely atrophic. No focal lesion. Otherwise normal pancreatic contour. No surrounding inflammatory changes. No main pancreatic ductal dilatation. Spleen: Not enlarged. No focal lesion. Adrenal Glands: No nodularity bilaterally. Kidneys: Bilateral renal cortical scarring. Bilateral  nephrolithiasis measuring up to 8 mm on the left and 4 mm on the right. No hydroureteronephrosis bilaterally. Three consecutive calcifications within the left pelvis measuring up to 9 mm likely within the distal left ureter (5:90-93). No right ureterolithiasis. No contour deforming renal mass. The urinary bladder is unremarkable. Bowel: No small or large bowel wall thickening or dilatation. Diffuse colonic diverticulosis. Poorly visualized transverse colon due to motion artifact. The appendix is unremarkable. Mesentery, Omentum, and Peritoneum: No simple free fluid ascites. No pneumoperitoneum. No mesenteric hematoma identified. No organized fluid collection. Pelvic Organs: The prostate is prominent. Lymph Nodes: No abdominal, pelvic, inguinal lymphadenopathy. Vasculature: No abdominal aorta or iliac aneurysm. Musculoskeletal: There is a lobulated 12.5 x 6 by 9.5 cm fluid density lesion along right lateral upper abdomen subcutaneus soft tissues (2:73, 5:38). Bilateral, right greater than left, fat containing inguinal hernias. No acute pelvic fracture. CT THORACIC SPINE FINDINGS Alignment: Normal. Vertebrae: Multilevel mild degenerative changes of the spine. No acute fracture or focal pathologic process.No severe osseous neural foraminal or central canal stenosis. Paraspinal and other soft tissues: Negative. CT LUMBAR SPINE FINDINGS Segmentation: 5 lumbar type vertebrae. Alignment: Normal. Vertebrae: Age-indeterminate, likely chronic, L1 compression fracture with greater than  20% height loss. Otherwise no definite acute fracture or focal pathologic process. Multilevel mild degenerative changes of the spine with moderate severe facet arthropathy of the lower lumbar spine. No severe osseous neural foraminal or central canal stenosis. Paraspinal and other soft tissues: Negative. IMPRESSION: 1. 2. No acute intracranial abnormality. 3. No acute displaced fracture or traumatic listhesis of the cervical spine. 4. No acute  traumatic injury to the chest, abdomen, or pelvis with limited evaluation on this noncontrast study. Please see below regarding positive non-traumatic findings. 5. No definite acute fracture or traumatic malalignment of the thoracic or lumbar spine. Age-indeterminate, likely chronic, L1 compression fracture with greater than 20% height loss. Correlate with tenderness to palpation to evaluate for an acute component. 6. Some limited evaluation due to respiratory motion artifact. Other imaging findings of potential clinical significance: 1. Three consecutive calcifications within the left pelvis measuring up to 9 mm likely within the distal left ureter. No associated proximal hydroureteronephrosis. Recommend correlation with urinalysis. 2. Bilateral nonobstructive nephrolithiasis measuring up to 8 mm on the left and 4 mm on the right. 3. A lobulated 12.5 x 6 x 9.5 cm fluid density lesion along the right lateral upper abdomen subcutaneus soft tissues is incompletely evaluated on this noncontrast study. Finding could represent a sebaceous cyst versus liquefied hematoma versus likely benign neoplasm. Correlate with physical exam. Correlate with prior cross-sectional imaging to evaluate for stability. 4. Scattered pulmonary micronodules calcified and noncalcified. No follow-up needed if patient is low-risk (and has no known or suspected primary neoplasm). Non-contrast chest CT can be considered in 12 months if patient is high-risk. This recommendation follows the consensus statement: Guidelines for Management of Incidental Pulmonary Nodules Detected on CT Images: From the Fleischner Society 2017; Radiology 2017; 284:228-243. 5. Diffuse colonic diverticulosis with no acute diverticulitis. 6. Prominent prostate 7. Bilateral, right greater than left, fat containing inguinal hernias. 8.  Aortic Atherosclerosis (ICD10-I70.0). Electronically Signed   By: Iven Finn M.D.   On: 02/18/2021 20:28   CT T-SPINE NO  CHARGE  Result Date: 02/24/2021 CLINICAL DATA:  Fall. Altered mental status. neighbor called after checking on pt and found him on the floor unresponsive EXAM: CT HEAD WITHOUT CONTRAST CT CERVICAL SPINE WITHOUT CONTRAST CT CHEST, ABDOMEN AND PELVIS WITHOUT CONTRAST CT THORACIC AND LUMBAR SPINE WITHOUT CONTRAST TECHNIQUE: Contiguous axial images were obtained from the base of the skull through the vertex without intravenous contrast. Multidetector CT imaging of the cervical spine was performed without intravenous contrast. Multiplanar CT image reconstructions were also generated. Multidetector CT imaging of the chest, abdomen and pelvis was performed following the standard protocol without IV contrast. Multidetector CT imaging of the thoracic and lumbar spine was performed without contrast. Multiplanar CT image reconstructions were also generated. COMPARISON:  None. FINDINGS: CT HEAD FINDINGS Brain: Cerebral ventricle sizes are concordant with the degree of cerebral volume loss. Patchy and confluent areas of decreased attenuation are noted throughout the deep and periventricular white matter of the cerebral hemispheres bilaterally, compatible with chronic 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 No evidence of large-territorial acute infarction. No parenchymal hemorrhage. No mass lesion. No extra-axial collection. No mass effect or midline shift. No hydrocephalus. Basilar cisterns are patent. Vascular: No hyperdense vessel. Skull: No acute fracture or focal lesion. Sinuses/Orbits: Mucosal thickening of the right frontal sinus. Otherwise the remaining paranasal sinuses and mastoid air cells are clear. The orbits are unremarkable. Other: None. CT CERVICAL FINDINGS Alignment: Grade 1 anterolisthesis of C4 on C5 likely due to degenerative changes.  Skull base and vertebrae: Multilevel moderate to severe degenerative changes of the spine with associated multilevel severe osseous neural foraminal  stenosis. No severe osseous central canal stenosis. No acute fracture. No aggressive appearing focal osseous lesion or focal pathologic process. Soft tissues and spinal canal: No prevertebral fluid or swelling. No visible canal hematoma. Upper chest: Unremarkable. Other: Patient is edentulous. CT CHEST: Ports and Devices: None. Lungs/airways: Bilateral lower lobe subsegmental atelectasis. Question bilateral lower lobe reticulations with limited evaluation due to motion artifact. No focal consolidation. Scattered calcified and noncalcified pulmonary micronodules. No pulmonary mass. No pulmonary contusion or laceration. No pneumatocele formation. The central airways are patent. Pleura: No pleural effusion. No pneumothorax. No hemothorax. Lymph Nodes: Limited evaluation for hilar lymphadenopathy on this noncontrast study. No mediastinal or axillary lymphadenopathy. Mediastinum: No pneumomediastinum. The thoracic aorta is normal in caliber. Atherosclerotic plaque. Four-vessel coronary calcifications. The heart is normal in size. No significant pericardial effusion. The esophagus is unremarkable. Possible tiny hiatal hernia. The thyroid is unremarkable. Chest Wall / Breasts: No chest wall mass. Musculoskeletal: No acute displaced rib or sternal fracture. Limited evaluation due to respiratory motion artifact. Visualized portions of bilateral upper extremities grossly unremarkable. CT ABDOMEN / PELVIS: Liver: Not enlarged. No focal lesion. Biliary System: The gallbladder distended with fluid with otherwise no radio-opaque gallstones. No biliary ductal dilatation. Pancreas: Limited evaluation due to respiratory motion artifact. Diffusely atrophic. No focal lesion. Otherwise normal pancreatic contour. No surrounding inflammatory changes. No main pancreatic ductal dilatation. Spleen: Not enlarged. No focal lesion. Adrenal Glands: No nodularity bilaterally. Kidneys: Bilateral renal cortical scarring. Bilateral nephrolithiasis  measuring up to 8 mm on the left and 4 mm on the right. No hydroureteronephrosis bilaterally. Three consecutive calcifications within the left pelvis measuring up to 9 mm likely within the distal left ureter (5:90-93). No right ureterolithiasis. No contour deforming renal mass. The urinary bladder is unremarkable. Bowel: No small or large bowel wall thickening or dilatation. Diffuse colonic diverticulosis. Poorly visualized transverse colon due to motion artifact. The appendix is unremarkable. Mesentery, Omentum, and Peritoneum: No simple free fluid ascites. No pneumoperitoneum. No mesenteric hematoma identified. No organized fluid collection. Pelvic Organs: The prostate is prominent. Lymph Nodes: No abdominal, pelvic, inguinal lymphadenopathy. Vasculature: No abdominal aorta or iliac aneurysm. Musculoskeletal: There is a lobulated 12.5 x 6 by 9.5 cm fluid density lesion along right lateral upper abdomen subcutaneus soft tissues (2:73, 5:38). Bilateral, right greater than left, fat containing inguinal hernias. No acute pelvic fracture. CT THORACIC SPINE FINDINGS Alignment: Normal. Vertebrae: Multilevel mild degenerative changes of the spine. No acute fracture or focal pathologic process.No severe osseous neural foraminal or central canal stenosis. Paraspinal and other soft tissues: Negative. CT LUMBAR SPINE FINDINGS Segmentation: 5 lumbar type vertebrae. Alignment: Normal. Vertebrae: Age-indeterminate, likely chronic, L1 compression fracture with greater than 20% height loss. Otherwise no definite acute fracture or focal pathologic process. Multilevel mild degenerative changes of the spine with moderate severe facet arthropathy of the lower lumbar spine. No severe osseous neural foraminal or central canal stenosis. Paraspinal and other soft tissues: Negative. IMPRESSION: 1. 2. No acute intracranial abnormality. 3. No acute displaced fracture or traumatic listhesis of the cervical spine. 4. No acute traumatic injury  to the chest, abdomen, or pelvis with limited evaluation on this noncontrast study. Please see below regarding positive non-traumatic findings. 5. No definite acute fracture or traumatic malalignment of the thoracic or lumbar spine. Age-indeterminate, likely chronic, L1 compression fracture with greater than 20% height loss. Correlate with tenderness to palpation  to evaluate for an acute component. 6. Some limited evaluation due to respiratory motion artifact. Other imaging findings of potential clinical significance: 1. Three consecutive calcifications within the left pelvis measuring up to 9 mm likely within the distal left ureter. No associated proximal hydroureteronephrosis. Recommend correlation with urinalysis. 2. Bilateral nonobstructive nephrolithiasis measuring up to 8 mm on the left and 4 mm on the right. 3. A lobulated 12.5 x 6 x 9.5 cm fluid density lesion along the right lateral upper abdomen subcutaneus soft tissues is incompletely evaluated on this noncontrast study. Finding could represent a sebaceous cyst versus liquefied hematoma versus likely benign neoplasm. Correlate with physical exam. Correlate with prior cross-sectional imaging to evaluate for stability. 4. Scattered pulmonary micronodules calcified and noncalcified. No follow-up needed if patient is low-risk (and has no known or suspected primary neoplasm). Non-contrast chest CT can be considered in 12 months if patient is high-risk. This recommendation follows the consensus statement: Guidelines for Management of Incidental Pulmonary Nodules Detected on CT Images: From the Fleischner Society 2017; Radiology 2017; 284:228-243. 5. Diffuse colonic diverticulosis with no acute diverticulitis. 6. Prominent prostate 7. Bilateral, right greater than left, fat containing inguinal hernias. 8.  Aortic Atherosclerosis (ICD10-I70.0). Electronically Signed   By: Iven Finn M.D.   On: 02/24/2021 20:28   CT L-SPINE NO CHARGE  Result Date:  03/13/2021 CLINICAL DATA:  Fall. Altered mental status. neighbor called after checking on pt and found him on the floor unresponsive EXAM: CT HEAD WITHOUT CONTRAST CT CERVICAL SPINE WITHOUT CONTRAST CT CHEST, ABDOMEN AND PELVIS WITHOUT CONTRAST CT THORACIC AND LUMBAR SPINE WITHOUT CONTRAST TECHNIQUE: Contiguous axial images were obtained from the base of the skull through the vertex without intravenous contrast. Multidetector CT imaging of the cervical spine was performed without intravenous contrast. Multiplanar CT image reconstructions were also generated. Multidetector CT imaging of the chest, abdomen and pelvis was performed following the standard protocol without IV contrast. Multidetector CT imaging of the thoracic and lumbar spine was performed without contrast. Multiplanar CT image reconstructions were also generated. COMPARISON:  None. FINDINGS: CT HEAD FINDINGS Brain: Cerebral ventricle sizes are concordant with the degree of cerebral volume loss. Patchy and confluent areas of decreased attenuation are noted throughout the deep and periventricular white matter of the cerebral hemispheres bilaterally, compatible with chronic 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 No evidence of large-territorial acute infarction. No parenchymal hemorrhage. No mass lesion. No extra-axial collection. No mass effect or midline shift. No hydrocephalus. Basilar cisterns are patent. Vascular: No hyperdense vessel. Skull: No acute fracture or focal lesion. Sinuses/Orbits: Mucosal thickening of the right frontal sinus. Otherwise the remaining paranasal sinuses and mastoid air cells are clear. The orbits are unremarkable. Other: None. CT CERVICAL FINDINGS Alignment: Grade 1 anterolisthesis of C4 on C5 likely due to degenerative changes. Skull base and vertebrae: Multilevel moderate to severe degenerative changes of the spine with associated multilevel severe osseous neural foraminal stenosis. No severe  osseous central canal stenosis. No acute fracture. No aggressive appearing focal osseous lesion or focal pathologic process. Soft tissues and spinal canal: No prevertebral fluid or swelling. No visible canal hematoma. Upper chest: Unremarkable. Other: Patient is edentulous. CT CHEST: Ports and Devices: None. Lungs/airways: Bilateral lower lobe subsegmental atelectasis. Question bilateral lower lobe reticulations with limited evaluation due to motion artifact. No focal consolidation. Scattered calcified and noncalcified pulmonary micronodules. No pulmonary mass. No pulmonary contusion or laceration. No pneumatocele formation. The central airways are patent. Pleura: No pleural effusion. No pneumothorax. No hemothorax. Lymph  Nodes: Limited evaluation for hilar lymphadenopathy on this noncontrast study. No mediastinal or axillary lymphadenopathy. Mediastinum: No pneumomediastinum. The thoracic aorta is normal in caliber. Atherosclerotic plaque. Four-vessel coronary calcifications. The heart is normal in size. No significant pericardial effusion. The esophagus is unremarkable. Possible tiny hiatal hernia. The thyroid is unremarkable. Chest Wall / Breasts: No chest wall mass. Musculoskeletal: No acute displaced rib or sternal fracture. Limited evaluation due to respiratory motion artifact. Visualized portions of bilateral upper extremities grossly unremarkable. CT ABDOMEN / PELVIS: Liver: Not enlarged. No focal lesion. Biliary System: The gallbladder distended with fluid with otherwise no radio-opaque gallstones. No biliary ductal dilatation. Pancreas: Limited evaluation due to respiratory motion artifact. Diffusely atrophic. No focal lesion. Otherwise normal pancreatic contour. No surrounding inflammatory changes. No main pancreatic ductal dilatation. Spleen: Not enlarged. No focal lesion. Adrenal Glands: No nodularity bilaterally. Kidneys: Bilateral renal cortical scarring. Bilateral nephrolithiasis measuring up to 8 mm  on the left and 4 mm on the right. No hydroureteronephrosis bilaterally. Three consecutive calcifications within the left pelvis measuring up to 9 mm likely within the distal left ureter (5:90-93). No right ureterolithiasis. No contour deforming renal mass. The urinary bladder is unremarkable. Bowel: No small or large bowel wall thickening or dilatation. Diffuse colonic diverticulosis. Poorly visualized transverse colon due to motion artifact. The appendix is unremarkable. Mesentery, Omentum, and Peritoneum: No simple free fluid ascites. No pneumoperitoneum. No mesenteric hematoma identified. No organized fluid collection. Pelvic Organs: The prostate is prominent. Lymph Nodes: No abdominal, pelvic, inguinal lymphadenopathy. Vasculature: No abdominal aorta or iliac aneurysm. Musculoskeletal: There is a lobulated 12.5 x 6 by 9.5 cm fluid density lesion along right lateral upper abdomen subcutaneus soft tissues (2:73, 5:38). Bilateral, right greater than left, fat containing inguinal hernias. No acute pelvic fracture. CT THORACIC SPINE FINDINGS Alignment: Normal. Vertebrae: Multilevel mild degenerative changes of the spine. No acute fracture or focal pathologic process.No severe osseous neural foraminal or central canal stenosis. Paraspinal and other soft tissues: Negative. CT LUMBAR SPINE FINDINGS Segmentation: 5 lumbar type vertebrae. Alignment: Normal. Vertebrae: Age-indeterminate, likely chronic, L1 compression fracture with greater than 20% height loss. Otherwise no definite acute fracture or focal pathologic process. Multilevel mild degenerative changes of the spine with moderate severe facet arthropathy of the lower lumbar spine. No severe osseous neural foraminal or central canal stenosis. Paraspinal and other soft tissues: Negative. IMPRESSION: 1. 2. No acute intracranial abnormality. 3. No acute displaced fracture or traumatic listhesis of the cervical spine. 4. No acute traumatic injury to the chest,  abdomen, or pelvis with limited evaluation on this noncontrast study. Please see below regarding positive non-traumatic findings. 5. No definite acute fracture or traumatic malalignment of the thoracic or lumbar spine. Age-indeterminate, likely chronic, L1 compression fracture with greater than 20% height loss. Correlate with tenderness to palpation to evaluate for an acute component. 6. Some limited evaluation due to respiratory motion artifact. Other imaging findings of potential clinical significance: 1. Three consecutive calcifications within the left pelvis measuring up to 9 mm likely within the distal left ureter. No associated proximal hydroureteronephrosis. Recommend correlation with urinalysis. 2. Bilateral nonobstructive nephrolithiasis measuring up to 8 mm on the left and 4 mm on the right. 3. A lobulated 12.5 x 6 x 9.5 cm fluid density lesion along the right lateral upper abdomen subcutaneus soft tissues is incompletely evaluated on this noncontrast study. Finding could represent a sebaceous cyst versus liquefied hematoma versus likely benign neoplasm. Correlate with physical exam. Correlate with prior cross-sectional imaging to evaluate for  stability. 4. Scattered pulmonary micronodules calcified and noncalcified. No follow-up needed if patient is low-risk (and has no known or suspected primary neoplasm). Non-contrast chest CT can be considered in 12 months if patient is high-risk. This recommendation follows the consensus statement: Guidelines for Management of Incidental Pulmonary Nodules Detected on CT Images: From the Fleischner Society 2017; Radiology 2017; 284:228-243. 5. Diffuse colonic diverticulosis with no acute diverticulitis. 6. Prominent prostate 7. Bilateral, right greater than left, fat containing inguinal hernias. 8.  Aortic Atherosclerosis (ICD10-I70.0). Electronically Signed   By: Iven Finn M.D.   On: 03/08/2021 20:28   DG Chest Port 1 View  Result Date: 03/16/2021 CLINICAL  DATA:  Shortness of breath. EXAM: PORTABLE CHEST 1 VIEW COMPARISON:  Chest CT 03/04/2021 FINDINGS: Lungs are clear except for a few densities at the left lung base. Heart size is normal. Atherosclerotic calcifications at the aortic arch. Negative for a pneumothorax. IMPRESSION: 1. No acute chest findings. 2. Few densities at the left lung base could represent mild atelectasis. Electronically Signed   By: Markus Daft M.D.   On: 03/16/2021 14:34   CT CHEST ABDOMEN PELVIS WO CONTRAST  Result Date: 03/06/2021 CLINICAL DATA:  Fall. Altered mental status. neighbor called after checking on pt and found him on the floor unresponsive EXAM: CT HEAD WITHOUT CONTRAST CT CERVICAL SPINE WITHOUT CONTRAST CT CHEST, ABDOMEN AND PELVIS WITHOUT CONTRAST CT THORACIC AND LUMBAR SPINE WITHOUT CONTRAST TECHNIQUE: Contiguous axial images were obtained from the base of the skull through the vertex without intravenous contrast. Multidetector CT imaging of the cervical spine was performed without intravenous contrast. Multiplanar CT image reconstructions were also generated. Multidetector CT imaging of the chest, abdomen and pelvis was performed following the standard protocol without IV contrast. Multidetector CT imaging of the thoracic and lumbar spine was performed without contrast. Multiplanar CT image reconstructions were also generated. COMPARISON:  None. FINDINGS: CT HEAD FINDINGS Brain: Cerebral ventricle sizes are concordant with the degree of cerebral volume loss. Patchy and confluent areas of decreased attenuation are noted throughout the deep and periventricular white matter of the cerebral hemispheres bilaterally, compatible with chronic 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 No evidence of large-territorial acute infarction. No parenchymal hemorrhage. No mass lesion. No extra-axial collection. No mass effect or midline shift. No hydrocephalus. Basilar cisterns are patent. Vascular: No hyperdense  vessel. Skull: No acute fracture or focal lesion. Sinuses/Orbits: Mucosal thickening of the right frontal sinus. Otherwise the remaining paranasal sinuses and mastoid air cells are clear. The orbits are unremarkable. Other: None. CT CERVICAL FINDINGS Alignment: Grade 1 anterolisthesis of C4 on C5 likely due to degenerative changes. Skull base and vertebrae: Multilevel moderate to severe degenerative changes of the spine with associated multilevel severe osseous neural foraminal stenosis. No severe osseous central canal stenosis. No acute fracture. No aggressive appearing focal osseous lesion or focal pathologic process. Soft tissues and spinal canal: No prevertebral fluid or swelling. No visible canal hematoma. Upper chest: Unremarkable. Other: Patient is edentulous. CT CHEST: Ports and Devices: None. Lungs/airways: Bilateral lower lobe subsegmental atelectasis. Question bilateral lower lobe reticulations with limited evaluation due to motion artifact. No focal consolidation. Scattered calcified and noncalcified pulmonary micronodules. No pulmonary mass. No pulmonary contusion or laceration. No pneumatocele formation. The central airways are patent. Pleura: No pleural effusion. No pneumothorax. No hemothorax. Lymph Nodes: Limited evaluation for hilar lymphadenopathy on this noncontrast study. No mediastinal or axillary lymphadenopathy. Mediastinum: No pneumomediastinum. The thoracic aorta is normal in caliber. Atherosclerotic plaque. Four-vessel coronary calcifications. The  heart is normal in size. No significant pericardial effusion. The esophagus is unremarkable. Possible tiny hiatal hernia. The thyroid is unremarkable. Chest Wall / Breasts: No chest wall mass. Musculoskeletal: No acute displaced rib or sternal fracture. Limited evaluation due to respiratory motion artifact. Visualized portions of bilateral upper extremities grossly unremarkable. CT ABDOMEN / PELVIS: Liver: Not enlarged. No focal lesion. Biliary  System: The gallbladder distended with fluid with otherwise no radio-opaque gallstones. No biliary ductal dilatation. Pancreas: Limited evaluation due to respiratory motion artifact. Diffusely atrophic. No focal lesion. Otherwise normal pancreatic contour. No surrounding inflammatory changes. No main pancreatic ductal dilatation. Spleen: Not enlarged. No focal lesion. Adrenal Glands: No nodularity bilaterally. Kidneys: Bilateral renal cortical scarring. Bilateral nephrolithiasis measuring up to 8 mm on the left and 4 mm on the right. No hydroureteronephrosis bilaterally. Three consecutive calcifications within the left pelvis measuring up to 9 mm likely within the distal left ureter (5:90-93). No right ureterolithiasis. No contour deforming renal mass. The urinary bladder is unremarkable. Bowel: No small or large bowel wall thickening or dilatation. Diffuse colonic diverticulosis. Poorly visualized transverse colon due to motion artifact. The appendix is unremarkable. Mesentery, Omentum, and Peritoneum: No simple free fluid ascites. No pneumoperitoneum. No mesenteric hematoma identified. No organized fluid collection. Pelvic Organs: The prostate is prominent. Lymph Nodes: No abdominal, pelvic, inguinal lymphadenopathy. Vasculature: No abdominal aorta or iliac aneurysm. Musculoskeletal: There is a lobulated 12.5 x 6 by 9.5 cm fluid density lesion along right lateral upper abdomen subcutaneus soft tissues (2:73, 5:38). Bilateral, right greater than left, fat containing inguinal hernias. No acute pelvic fracture. CT THORACIC SPINE FINDINGS Alignment: Normal. Vertebrae: Multilevel mild degenerative changes of the spine. No acute fracture or focal pathologic process.No severe osseous neural foraminal or central canal stenosis. Paraspinal and other soft tissues: Negative. CT LUMBAR SPINE FINDINGS Segmentation: 5 lumbar type vertebrae. Alignment: Normal. Vertebrae: Age-indeterminate, likely chronic, L1 compression fracture  with greater than 20% height loss. Otherwise no definite acute fracture or focal pathologic process. Multilevel mild degenerative changes of the spine with moderate severe facet arthropathy of the lower lumbar spine. No severe osseous neural foraminal or central canal stenosis. Paraspinal and other soft tissues: Negative. IMPRESSION: 1. 2. No acute intracranial abnormality. 3. No acute displaced fracture or traumatic listhesis of the cervical spine. 4. No acute traumatic injury to the chest, abdomen, or pelvis with limited evaluation on this noncontrast study. Please see below regarding positive non-traumatic findings. 5. No definite acute fracture or traumatic malalignment of the thoracic or lumbar spine. Age-indeterminate, likely chronic, L1 compression fracture with greater than 20% height loss. Correlate with tenderness to palpation to evaluate for an acute component. 6. Some limited evaluation due to respiratory motion artifact. Other imaging findings of potential clinical significance: 1. Three consecutive calcifications within the left pelvis measuring up to 9 mm likely within the distal left ureter. No associated proximal hydroureteronephrosis. Recommend correlation with urinalysis. 2. Bilateral nonobstructive nephrolithiasis measuring up to 8 mm on the left and 4 mm on the right. 3. A lobulated 12.5 x 6 x 9.5 cm fluid density lesion along the right lateral upper abdomen subcutaneus soft tissues is incompletely evaluated on this noncontrast study. Finding could represent a sebaceous cyst versus liquefied hematoma versus likely benign neoplasm. Correlate with physical exam. Correlate with prior cross-sectional imaging to evaluate for stability. 4. Scattered pulmonary micronodules calcified and noncalcified. No follow-up needed if patient is low-risk (and has no known or suspected primary neoplasm). Non-contrast chest CT can be considered in 12  months if patient is high-risk. This recommendation follows the  consensus statement: Guidelines for Management of Incidental Pulmonary Nodules Detected on CT Images: From the Fleischner Society 2017; Radiology 2017; 284:228-243. 5. Diffuse colonic diverticulosis with no acute diverticulitis. 6. Prominent prostate 7. Bilateral, right greater than left, fat containing inguinal hernias. 8.  Aortic Atherosclerosis (ICD10-I70.0). Electronically Signed   By: Iven Finn M.D.   On: 02/13/2021 20:28    Microbiology Recent Results (from the past 240 hour(s))  Resp Panel by RT-PCR (Flu A&B, Covid)     Status: Abnormal   Collection Time: 02/24/2021  5:55 PM   Specimen: Nasopharyngeal(NP) swabs in vial transport medium  Result Value Ref Range Status   SARS Coronavirus 2 by RT PCR POSITIVE (A) NEGATIVE Final    Comment: CRITICAL RESULT CALLED TO, READ BACK BY AND VERIFIED WITH: KELLY R '@[id number]'$  03/13/2021 LFD (NOTE) SARS-CoV-2 target nucleic acids are DETECTED.  The SARS-CoV-2 RNA is generally detectable in upper respiratory specimens during the acute phase of infection. Positive results are indicative of the presence of the identified virus, but do not rule out bacterial infection or co-infection with other pathogens not detected by the test. Clinical correlation with patient history and other diagnostic information is necessary to determine patient infection status. The expected result is Negative.  Fact Sheet for Patients: EntrepreneurPulse.com.au  Fact Sheet for Healthcare Providers: IncredibleEmployment.be  This test is not yet approved or cleared by the [place] FDA and  has been authorized for detection and/or diagnosis of SARS-CoV-2 by FDA under an Emergency Use Authorization (EUA).  This EUA will remain in effect (meaning this test can  be used) for the duration of  the COVID-19 declaration under Section 564(b)(1) of the Act, 21 U.S.C. section 360bbb-3(b)(1), unless the authorization is terminated or revoked  sooner.     Influenza A by PCR NEGATIVE NEGATIVE Final   Influenza B by PCR NEGATIVE NEGATIVE Final    Comment: (NOTE) The Xpert Xpress SARS-CoV-2/FLU/RSV plus assay is intended as an aid in the diagnosis of influenza from Nasopharyngeal swab specimens and should not be used as a sole basis for treatment. Nasal washings and aspirates are unacceptable for Xpert Xpress SARS-CoV-2/FLU/RSV testing.  Fact Sheet for Patients: EntrepreneurPulse.com.au  Fact Sheet for Healthcare Providers: IncredibleEmployment.be  This test is not yet approved or cleared by the [place] FDA and has been authorized for detection and/or diagnosis of SARS-CoV-2 by FDA under an Emergency Use Authorization (EUA). This EUA will remain in effect (meaning this test can be used) for the duration of the COVID-19 declaration under Section 564(b)(1) of the Act, 21 U.S.C. section 360bbb-3(b)(1), unless the authorization is terminated or revoked.  Performed at [facility], [address] [place], [place] [address]   Blood culture (routine single)     Status: Abnormal   Collection Time: 02/20/2021  5:56 PM   Specimen: BLOOD  Result Value Ref Range Status   Specimen Description   Final    BLOOD RIGHT ANTECUBITAL Performed at [facility], [address] [place], [place] [address]    Special Requests   Final    BOTTLES DRAWN AEROBIC AND ANAEROBIC Blood Culture adequate volume Performed at [facility], [address] [place], [place] [address]    Culture  Setup Time   Final    Organism ID to follow AEROBIC BOTTLE ONLY GRAM POSITIVE COCCI IN CLUSTERS CRITICAL RESULT CALLED TO, READ BACK BY AND VERIFIED WITH: CAROLINE CHILDS '@1302'$  ON 07.31.22.[id number] Performed at  [facility], [address] [place], [place] [address]    Culture (A)  Final    STAPHYLOCOCCUS EPIDERMIDIS THE SIGNIFICANCE OF ISOLATING THIS ORGANISM FROM A SINGLE  VENIPUNCTURE CANNOT BE PREDICTED WITHOUT FURTHER CLINICAL AND CULTURE CORRELATION. SUSCEPTIBILITIES AVAILABLE ONLY ON REQUEST. Performed at [facility], [place] [address] [place], [place] [address]    Report Status 03/14/2021 FINAL  Final  Urine Culture     Status: None   Collection Time: 02/24/2021  5:56 PM   Specimen: Urine, Random  Result Value Ref Range Status   Specimen Description   Final    URINE, RANDOM Performed at [facility], [address], [place], [place] [address]    Special Requests   Final    NONE Performed at [facility], [address] [place] [address] [place],  [place] [address]    Culture   Final    NO GROWTH Performed at [facility], [place] [address] [place], [place] [address]    Report Status 03/14/2021 FINAL  Final  Blood Culture ID Panel (Reflexed)     Status: Abnormal   Collection Time: 03/08/2021  5:56 PM  Result Value Ref Range Status   Enterococcus faecalis NOT DETECTED NOT DETECTED Final   Enterococcus Faecium NOT DETECTED NOT DETECTED Final   Listeria monocytogenes NOT DETECTED NOT DETECTED Final   Staphylococcus species DETECTED (A) NOT DETECTED Final    Comment: CRITICAL RESULT CALLED TO, READ BACK BY AND VERIFIED WITH: CAROLINE CHILDS '@1302'$  ON [id number]    Staphylococcus aureus (BCID) NOT DETECTED NOT DETECTED Final   Staphylococcus epidermidis DETECTED (A) NOT DETECTED Final    Comment: CRITICAL RESULT CALLED TO, READ BACK BY AND VERIFIED WITH: CAROLINE CHILDS '@[id number]'$  ON [id number]    Staphylococcus lugdunensis NOT DETECTED NOT DETECTED Final   Streptococcus species NOT DETECTED NOT DETECTED Final   Streptococcus agalactiae NOT DETECTED NOT DETECTED Final   Streptococcus pneumoniae NOT DETECTED NOT DETECTED Final   Streptococcus pyogenes NOT DETECTED NOT DETECTED Final   A.calcoaceticus-baumannii NOT DETECTED NOT DETECTED Final   Bacteroides fragilis NOT DETECTED NOT DETECTED Final   Enterobacterales NOT DETECTED NOT DETECTED  Final   Enterobacter cloacae complex NOT DETECTED NOT DETECTED Final   Escherichia coli NOT DETECTED NOT DETECTED Final   Klebsiella aerogenes NOT DETECTED NOT DETECTED Final   Klebsiella oxytoca NOT DETECTED NOT DETECTED Final   Klebsiella pneumoniae NOT DETECTED NOT DETECTED Final   Proteus species NOT DETECTED NOT DETECTED Final   Salmonella species NOT DETECTED NOT DETECTED Final   Serratia marcescens NOT DETECTED NOT DETECTED Final   Haemophilus influenzae NOT DETECTED NOT DETECTED Final   Neisseria meningitidis NOT DETECTED NOT DETECTED Final   Pseudomonas aeruginosa NOT DETECTED NOT DETECTED Final   Stenotrophomonas maltophilia NOT DETECTED NOT DETECTED Final   Candida albicans NOT DETECTED NOT DETECTED Final   Candida auris NOT DETECTED NOT DETECTED Final   Candida glabrata NOT DETECTED NOT DETECTED Final   Candida krusei NOT DETECTED NOT DETECTED Final   Candida parapsilosis NOT DETECTED NOT DETECTED Final   Candida tropicalis NOT DETECTED NOT DETECTED Final   Cryptococcus neoformans/gattii NOT DETECTED NOT DETECTED Final   Methicillin resistance mecA/C NOT DETECTED NOT DETECTED Final    Comment: Performed at [facility], [address], [place], [place] [address]    Lab Basic Metabolic Panel: Recent Labs  Lab 02/24/2021 1756 03/13/21 0715 03/14/21 0510 03/15/21 0440 03/16/21 0404  NA 137 142 145 147* 148*  K 4.2  4.4 4.5 4.5 3.7  CL 106 107 113* 116* 116*  CO2 17* 24 21* 19* 24  GLUCOSE 169* 130* 150* 181* 155*  BUN 72* 56* 50* 50* 44*  CREATININE 2.85* 1.93* 1.44* 1.40* 1.38*  CALCIUM 9.7 9.1 9.4 9.6 9.5  MG 3.0*  --   --   --   --    Liver Function Tests: Recent Labs  Lab 02/12/2021 1756 03/13/21 0715 03/14/21 0510 03/15/21 0440 03/16/21 0404  AST 34 '25 29 30 26  '$ ALT '26 20 26 '$ 33 33  ALKPHOS 67 53 59 63 67  BILITOT 1.9* 1.3* 1.5* 1.5* 1.0  PROT 7.9 6.1* 6.5 6.9 6.7  ALBUMIN 4.3 3.0* 3.3* 3.5 3.2*   No results for input(s): LIPASE, AMYLASE in  the last 168 hours. Recent Labs  Lab 03/10/2021 2009  AMMONIA 12   CBC: Recent Labs  Lab 03/02/2021 1756 03/13/21 0715 03/14/21 0510 03/15/21 0440 03/16/21 0404  WBC 11.7* 7.6 7.1 8.4 11.5*  NEUTROABS 9.9* 6.6 6.6 7.9* 9.7*  HGB 18.3* 15.8 16.4 16.9 17.0  HCT 51.3 43.4 46.7 48.3 50.0  MCV 88.3 90.6 88.8 91.0 91.4  PLT 149* 102* 97* 88* 80*   Cardiac Enzymes: Recent Labs  Lab 02/12/2021 1756 03/13/21 0715 03/14/21 0510  CKTOTAL 746* 371 208   Sepsis Labs: Recent Labs  Lab 03/08/2021 1756 02/22/2021 2009 03/03/2021 2250 03/13/21 0715 03/14/21 0510 03/15/21 0440 03/16/21 0404  PROCALCITON 0.21  --   --  0.11 0.11  --   --   WBC 11.7*  --   --  7.6 7.1 8.4 11.5*  LATICACIDVEN  --  2.6* 3.0*  --  2.0*  --   --     Procedures/Operations     Jehad Bisono 03/30/21, 3:35 PM

## 2021-04-14 NOTE — Care Management Important Message (Signed)
Important Message  Patient Details  Name: Rithy Dorward MRN: [id number] Date of Birth: 1940-11-25   Medicare Important Message Given:  Other (see comment)  Patient placed on comfort care 8/3. Per MD pt. Basically unresponsive since admission so Important Message from Medicare withheld.  Juliann Pulse A Wille Aubuchon April 05, 2021, 8:19 AM

## 2021-04-14 DEATH — deceased

## 2022-06-10 IMAGING — CT CT HEAD W/O CM
2 of 3 series · 11 of 47 positions shown, 13 images · non-contrast
Comparison: None.

CLINICAL DATA: Fall. Altered mental status. neighbor called after
checking on pt and found him on the floor unresponsive

EXAM:
CT HEAD WITHOUT CONTRAST
CT CERVICAL SPINE WITHOUT CONTRAST
CT CHEST, ABDOMEN AND PELVIS WITHOUT CONTRAST
CT THORACIC AND LUMBAR SPINE WITHOUT CONTRAST
TECHNIQUE: Contiguous axial images were obtained from the base of the skull
through the vertex without intravenous contrast.

[Series 2: head wo · axial · 0.45mm/px · z∈[+217,+352]mm · 8 of 33 slices shown, 10 images]
[im 3/33  brain]
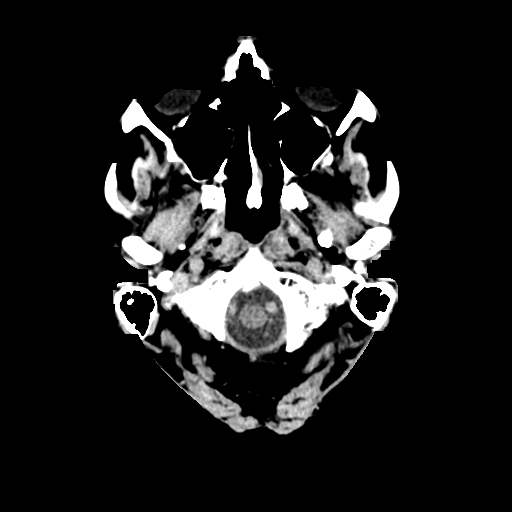
[im 3/33  bone]
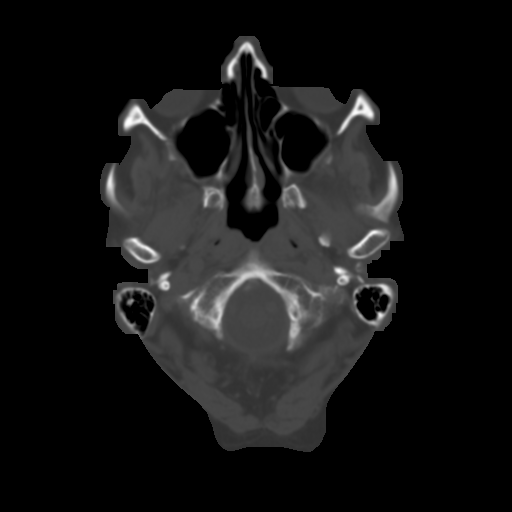
[im 7/33  brain]
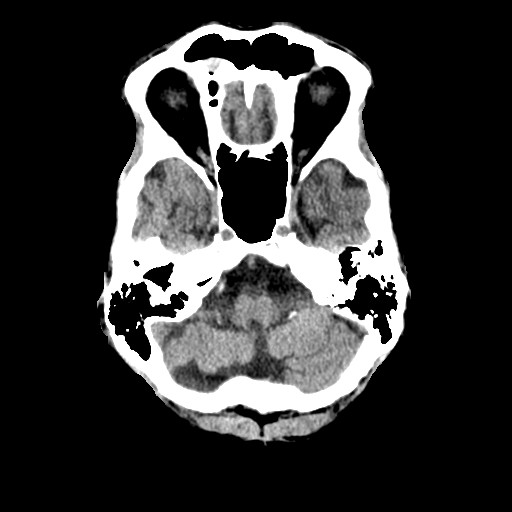
[im 10/33  brain]
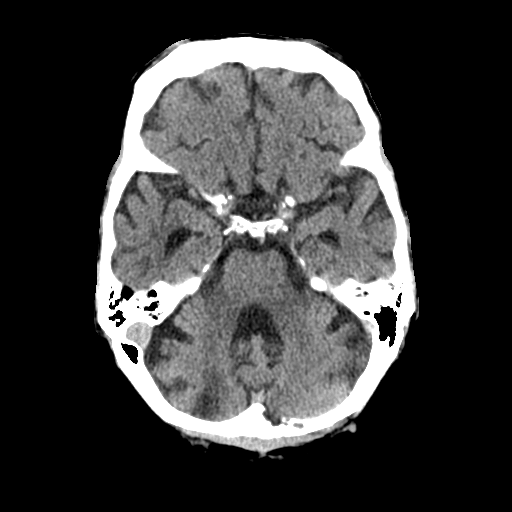
[im 15/33  brain]
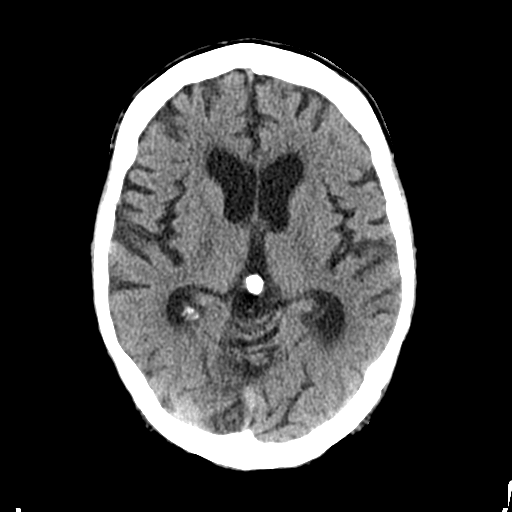
[im 18/33  brain]
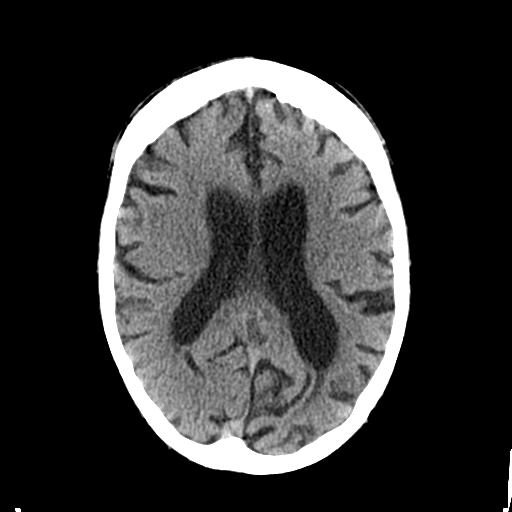
[im 18/33  bone]
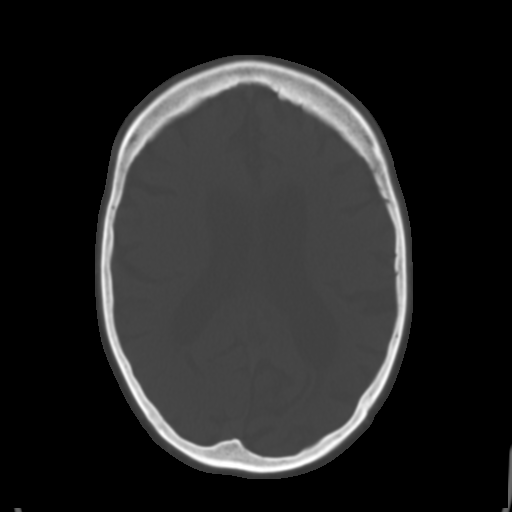
[im 23/33  brain]
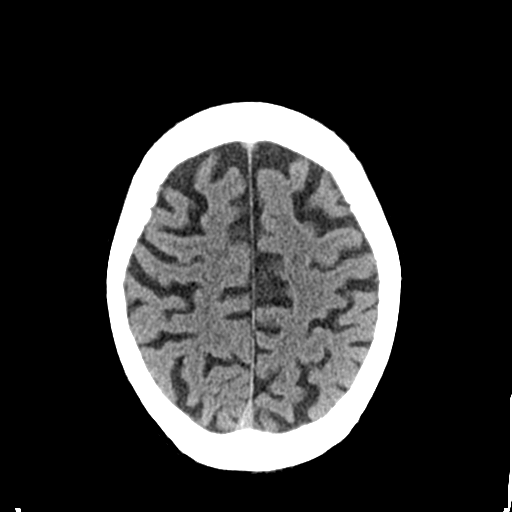
[im 26/33  brain]
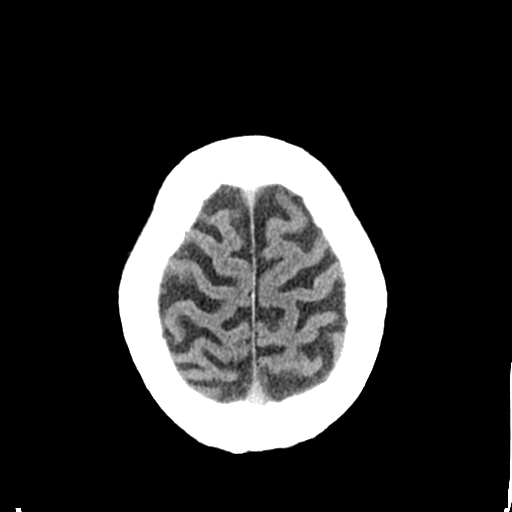
[im 30/33  brain]
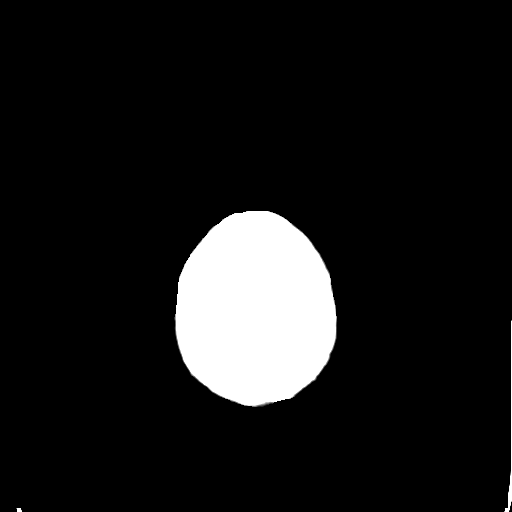

[Series 4: coronal soft tissue · coronal · 0.31mm/px · 3 of 75 slices shown]
[im 25/75  brain]
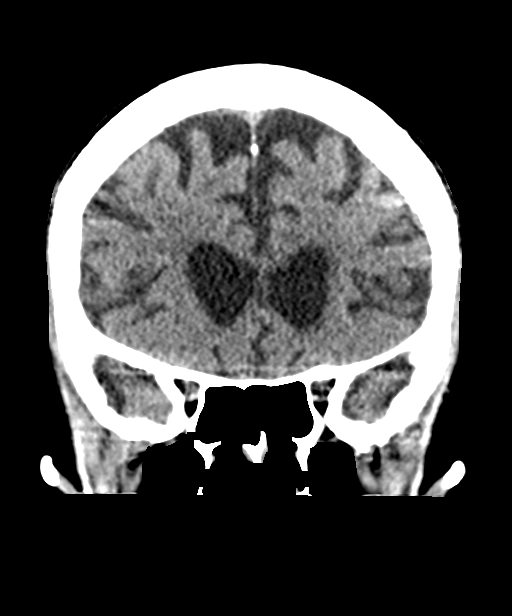
[im 33/75  brain]
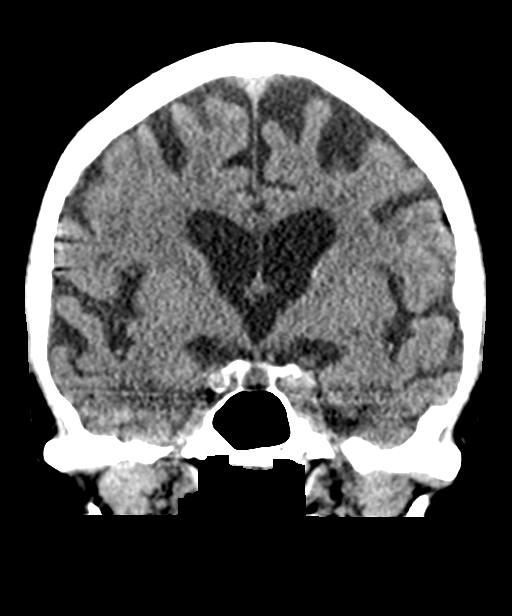
[im 42/75  brain]
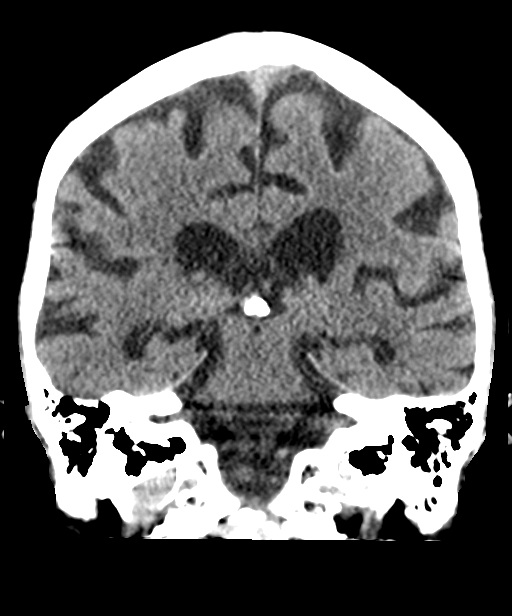

[11 of 47 positions shown; findings below may reference images not displayed]

Multidetector CT imaging of the cervical spine was performed without
intravenous contrast. Multiplanar CT image reconstructions were also
generated.

Multidetector CT imaging of the chest, abdomen and pelvis was
performed following the standard protocol without IV contrast.

Multidetector CT imaging of the thoracic and lumbar spine was
performed without contrast. Multiplanar CT image reconstructions
were also generated.
FINDINGS: CT HEAD FINDINGS

Brain:

Cerebral ventricle sizes are concordant with the degree of cerebral
volume loss. Patchy and confluent areas of decreased attenuation are
noted throughout the deep and periventricular white matter of the
cerebral hemispheres bilaterally, compatible with chronic
microvascular ischemic disease. Right cerebellar encephalomalacia.

No evidence of large-territorial acute infarction. No parenchymal
hemorrhage. No mass lesion. No extra-axial collection.

No mass effect or midline shift. No hydrocephalus. Basilar cisterns
are patent.

Vascular: No hyperdense vessel.

Skull: No acute fracture or focal lesion.

Sinuses/Orbits: Mucosal thickening of the right frontal sinus.
Otherwise the remaining paranasal sinuses and mastoid air cells are
clear. The orbits are unremarkable.

Other: None.

CT CERVICAL FINDINGS

Alignment: Grade 1 anterolisthesis of C4 on C5 likely due to
degenerative changes.

Skull base and vertebrae: Multilevel moderate to severe degenerative
changes of the spine with associated multilevel severe osseous
neural foraminal stenosis. No severe osseous central canal stenosis.
No acute fracture. No aggressive appearing focal osseous lesion or
focal pathologic process.

Soft tissues and spinal canal: No prevertebral fluid or swelling. No
visible canal hematoma.

Upper chest: Unremarkable.

Other: Patient is edentulous.

CT CHEST:
Ports and Devices: None.

Lungs/airways:

Bilateral lower lobe subsegmental atelectasis. Question bilateral
lower lobe reticulations with limited evaluation due to motion
artifact. No focal consolidation. Scattered calcified and
noncalcified pulmonary micronodules. No pulmonary mass. No pulmonary
contusion or laceration. No pneumatocele formation.

The central airways are patent.

Pleura: No pleural effusion. No pneumothorax. No hemothorax.

Lymph Nodes: Limited evaluation for hilar lymphadenopathy on this
noncontrast study. No mediastinal or axillary lymphadenopathy.

Mediastinum:

No pneumomediastinum.

The thoracic aorta is normal in caliber. Atherosclerotic plaque.
Four-vessel coronary calcifications. The heart is normal in size. No
significant pericardial effusion. The esophagus is unremarkable.
Possible tiny hiatal hernia.

The thyroid is unremarkable.

Chest Wall / Breasts: No chest wall mass.

Musculoskeletal: No acute displaced rib or sternal fracture. Limited
evaluation due to respiratory motion artifact.

Visualized portions of bilateral upper extremities grossly
unremarkable.

CT ABDOMEN / PELVIS:
Liver: Not enlarged. No focal lesion.

Biliary System: The gallbladder distended with fluid with otherwise
no radio-opaque gallstones. No biliary ductal dilatation.

Pancreas: Limited evaluation due to respiratory motion artifact.
Diffusely atrophic. No focal lesion. Otherwise normal pancreatic
contour. No surrounding inflammatory changes. No main pancreatic
ductal dilatation.
Spleen: Not enlarged. No focal lesion.

Adrenal Glands: No nodularity bilaterally.

Kidneys:

Bilateral renal cortical scarring. Bilateral nephrolithiasis
measuring up to 8 mm on the left and 4 mm on the right. No
hydroureteronephrosis bilaterally. Three consecutive calcifications
within the left pelvis measuring up to 9 mm likely within the distal
left ureter ([DATE]). No right ureterolithiasis. No contour
deforming renal mass.

The urinary bladder is unremarkable.

Bowel: No small or large bowel wall thickening or dilatation.
Diffuse colonic diverticulosis. Poorly visualized transverse colon
due to motion artifact. The appendix is unremarkable.

Mesentery, Omentum, and Peritoneum: No simple free fluid ascites. No
pneumoperitoneum. No mesenteric hematoma identified. No organized
fluid collection.

Pelvic Organs: The prostate is prominent.

Lymph Nodes: No abdominal, pelvic, inguinal lymphadenopathy.

Vasculature: No abdominal aorta or iliac aneurysm.

Musculoskeletal:

There is a lobulated 12.5 x 6 by 9.5 cm fluid density lesion along

Bilateral, right greater than left, fat containing inguinal hernias.

No acute pelvic fracture.

CT THORACIC SPINE FINDINGS

Alignment: Normal.

Vertebrae: Multilevel mild degenerative changes of the spine. No
acute fracture or focal pathologic process.No severe osseous neural
foraminal or central canal stenosis.

Paraspinal and other soft tissues: Negative.

CT LUMBAR SPINE FINDINGS

Segmentation: 5 lumbar type vertebrae.

Alignment: Normal.

Vertebrae: Age-indeterminate, likely chronic, L1 compression
fracture with greater than 20% height loss. Otherwise no definite
acute fracture or focal pathologic process. Multilevel mild
degenerative changes of the spine with moderate severe facet
arthropathy of the lower lumbar spine. No severe osseous neural
foraminal or central canal stenosis.

Paraspinal and other soft tissues: Negative.
IMPRESSION: 1.
2. No acute intracranial abnormality.
3. No acute displaced fracture or traumatic listhesis of the
cervical spine.
4. No acute traumatic injury to the chest, abdomen, or pelvis with
limited evaluation on this noncontrast study. Please see below
regarding positive non-traumatic findings.

5. No definite acute fracture or traumatic malalignment of the
thoracic or lumbar spine. Age-indeterminate, likely chronic, L1
compression fracture with greater than 20% height loss. Correlate
with tenderness to palpation to evaluate for an acute component.
6. Some limited evaluation due to respiratory motion artifact.

Other imaging findings of potential clinical significance:

1. Three consecutive calcifications within the left pelvis measuring
up to 9 mm likely within the distal left ureter. No associated
proximal hydroureteronephrosis. Recommend correlation with
urinalysis.
2. Bilateral nonobstructive nephrolithiasis measuring up to 8 mm on
the left and 4 mm on the right.
3. A lobulated 12.5 x 6 x 9.5 cm fluid density lesion along the
right lateral upper abdomen subcutaneus soft tissues is incompletely
evaluated on this noncontrast study. Finding could represent a
sebaceous cyst versus liquefied hematoma versus likely benign
neoplasm. Correlate with physical exam. Correlate with prior
cross-sectional imaging to evaluate for stability.
4. Scattered pulmonary micronodules calcified and noncalcified. No
follow-up needed if patient is low-risk (and has no known or
suspected primary neoplasm). Non-contrast chest CT can be considered
in 12 months if patient is high-risk. This recommendation follows
the consensus statement: Guidelines for Management of Incidental
Pulmonary Nodules Detected on CT Images: From the [HOSPITAL]
5. Diffuse colonic diverticulosis with no acute diverticulitis.
6. Prominent prostate
7. Bilateral, right greater than left, fat containing inguinal
hernias.
8.  Aortic Atherosclerosis (FHJF6-8SI.I).

## 2022-06-10 IMAGING — CT CT L SPINE W/O CM
2 series · 11 of 20 positions shown, 13 images · non-contrast
Comparison: None.

CLINICAL DATA: Fall. Altered mental status. neighbor called after
checking on pt and found him on the floor unresponsive

EXAM:
CT HEAD WITHOUT CONTRAST
CT CERVICAL SPINE WITHOUT CONTRAST
CT CHEST, ABDOMEN AND PELVIS WITHOUT CONTRAST
CT THORACIC AND LUMBAR SPINE WITHOUT CONTRAST
TECHNIQUE: Contiguous axial images were obtained from the base of the skull
through the vertex without intravenous contrast.

[Series 3: lspine · axial · 0.49mm/px · z∈[-458,-172]mm · 8 of 171 slices shown, 10 images (1 of 2)]
[im 14/171  soft-tissue]
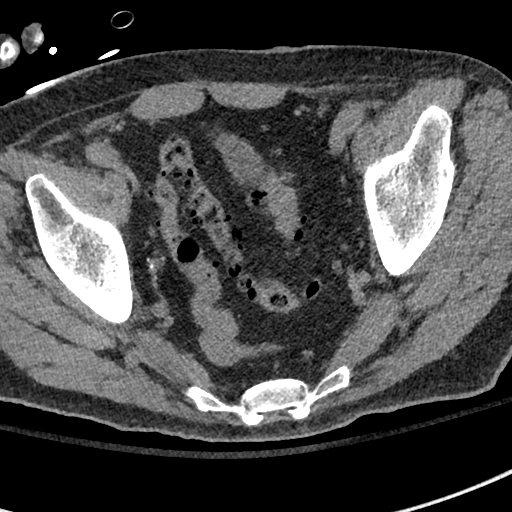
[im 14/171  bone]
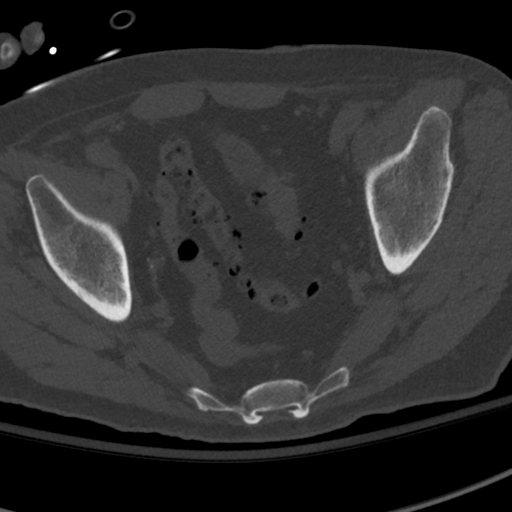
[im 40/171  bone]
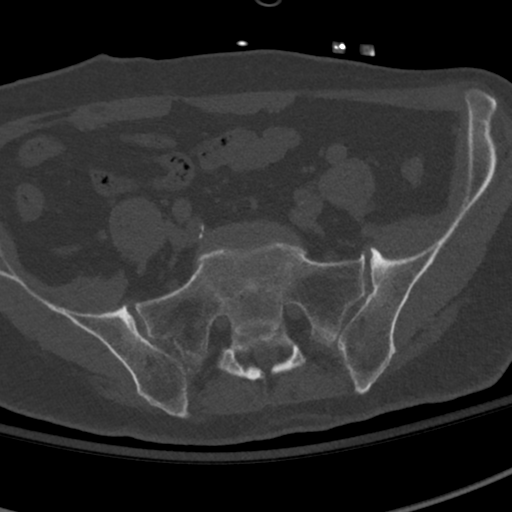
[im 53/171  bone]
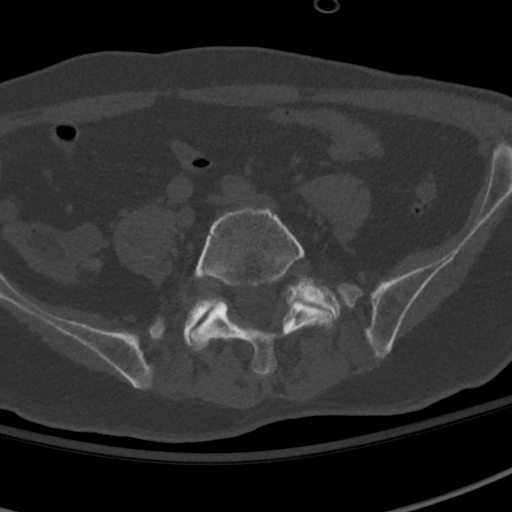
[im 79/171  bone]
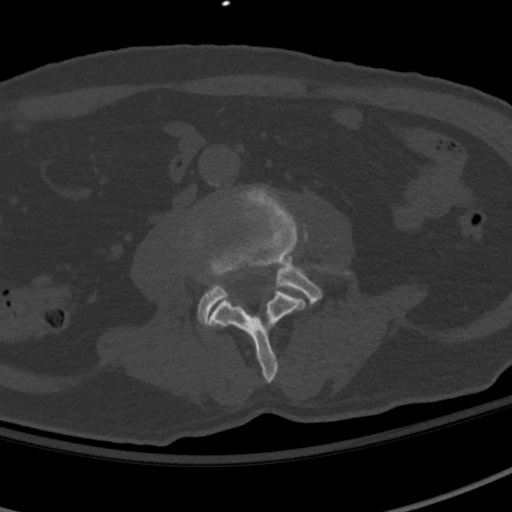
[im 92/171  soft-tissue]
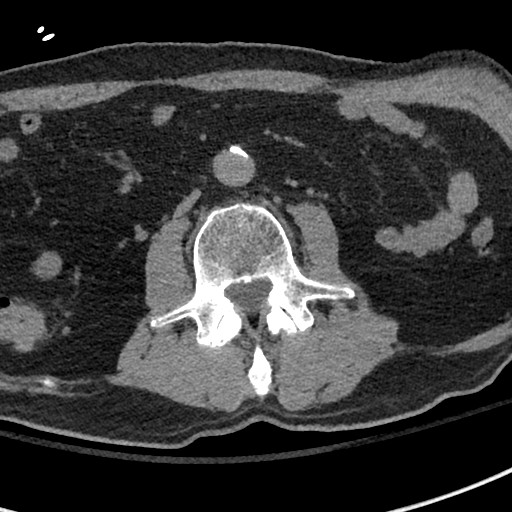
[im 92/171  bone]
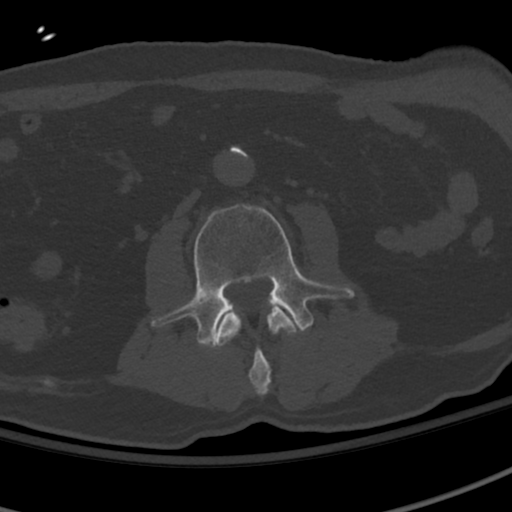
[im 118/171  bone]
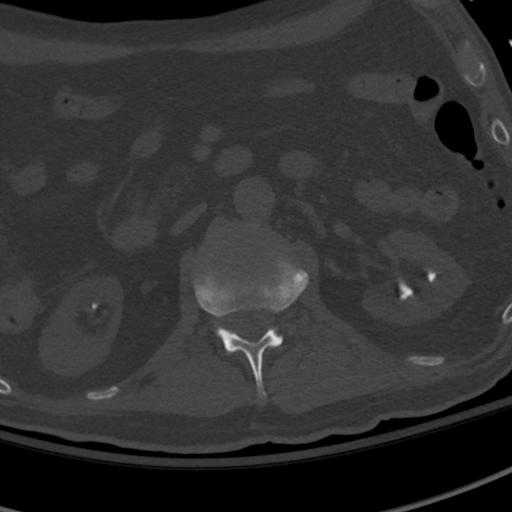
[im 131/171  bone]
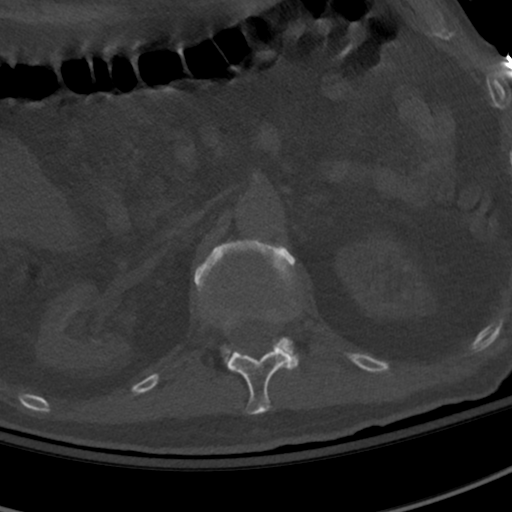
[im 157/171  bone]
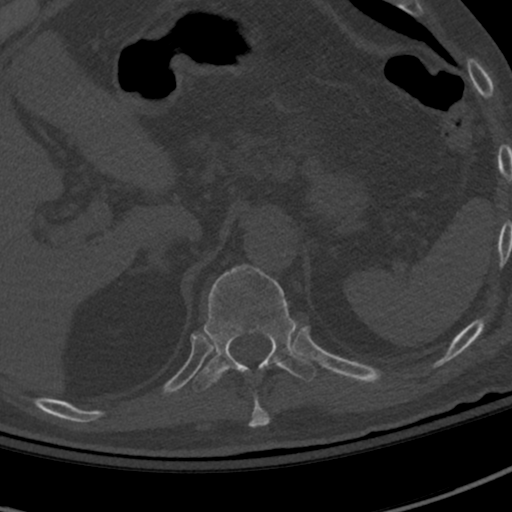

[Series 5: lspine · coronal · 0.52mm/px · 3 of 120 slices shown (2 of 2)]
[im 24/120  bone]
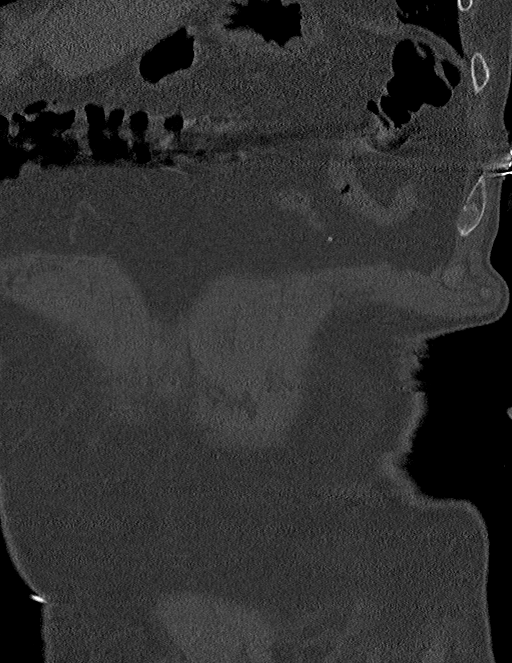
[im 48/120  bone]
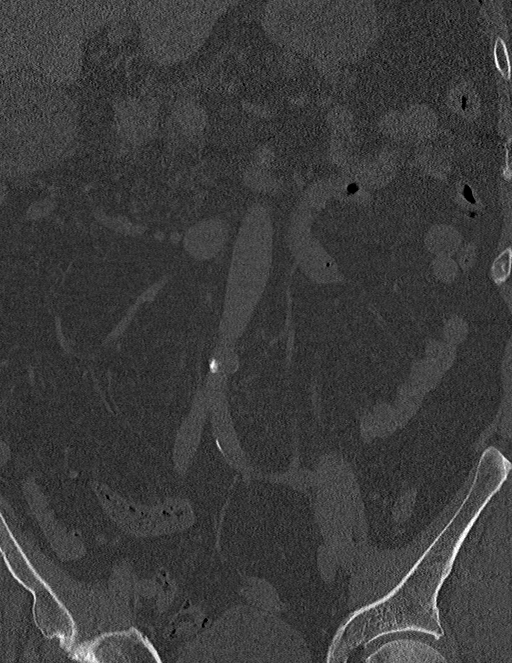
[im 72/120  bone]
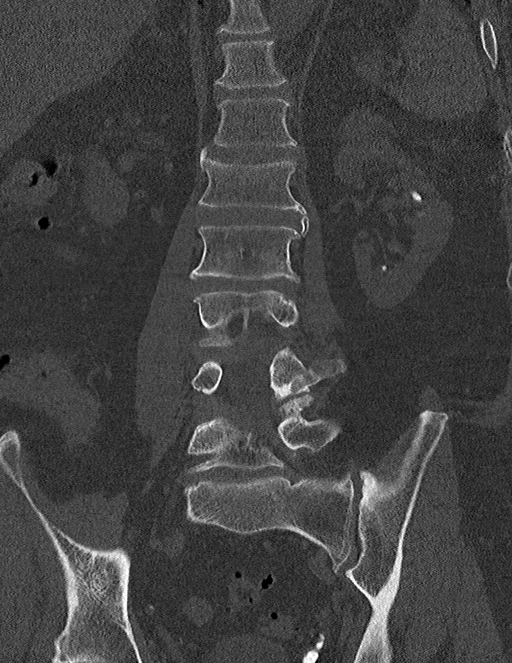

[11 of 20 positions shown; findings below may reference images not displayed]

Multidetector CT imaging of the cervical spine was performed without
intravenous contrast. Multiplanar CT image reconstructions were also
generated.

Multidetector CT imaging of the chest, abdomen and pelvis was
performed following the standard protocol without IV contrast.

Multidetector CT imaging of the thoracic and lumbar spine was
performed without contrast. Multiplanar CT image reconstructions
were also generated.
FINDINGS: CT HEAD FINDINGS

Brain:

Cerebral ventricle sizes are concordant with the degree of cerebral
volume loss. Patchy and confluent areas of decreased attenuation are
noted throughout the deep and periventricular white matter of the
cerebral hemispheres bilaterally, compatible with chronic
microvascular ischemic disease. Right cerebellar encephalomalacia.

No evidence of large-territorial acute infarction. No parenchymal
hemorrhage. No mass lesion. No extra-axial collection.

No mass effect or midline shift. No hydrocephalus. Basilar cisterns
are patent.

Vascular: No hyperdense vessel.

Skull: No acute fracture or focal lesion.

Sinuses/Orbits: Mucosal thickening of the right frontal sinus.
Otherwise the remaining paranasal sinuses and mastoid air cells are
clear. The orbits are unremarkable.

Other: None.

CT CERVICAL FINDINGS

Alignment: Grade 1 anterolisthesis of C4 on C5 likely due to
degenerative changes.

Skull base and vertebrae: Multilevel moderate to severe degenerative
changes of the spine with associated multilevel severe osseous
neural foraminal stenosis. No severe osseous central canal stenosis.
No acute fracture. No aggressive appearing focal osseous lesion or
focal pathologic process.

Soft tissues and spinal canal: No prevertebral fluid or swelling. No
visible canal hematoma.

Upper chest: Unremarkable.

Other: Patient is edentulous.

CT CHEST:
Ports and Devices: None.

Lungs/airways:

Bilateral lower lobe subsegmental atelectasis. Question bilateral
lower lobe reticulations with limited evaluation due to motion
artifact. No focal consolidation. Scattered calcified and
noncalcified pulmonary micronodules. No pulmonary mass. No pulmonary
contusion or laceration. No pneumatocele formation.

The central airways are patent.

Pleura: No pleural effusion. No pneumothorax. No hemothorax.

Lymph Nodes: Limited evaluation for hilar lymphadenopathy on this
noncontrast study. No mediastinal or axillary lymphadenopathy.

Mediastinum:

No pneumomediastinum.

The thoracic aorta is normal in caliber. Atherosclerotic plaque.
Four-vessel coronary calcifications. The heart is normal in size. No
significant pericardial effusion. The esophagus is unremarkable.
Possible tiny hiatal hernia.

The thyroid is unremarkable.

Chest Wall / Breasts: No chest wall mass.

Musculoskeletal: No acute displaced rib or sternal fracture. Limited
evaluation due to respiratory motion artifact.

Visualized portions of bilateral upper extremities grossly
unremarkable.

CT ABDOMEN / PELVIS:
Liver: Not enlarged. No focal lesion.

Biliary System: The gallbladder distended with fluid with otherwise
no radio-opaque gallstones. No biliary ductal dilatation.

Pancreas: Limited evaluation due to respiratory motion artifact.
Diffusely atrophic. No focal lesion. Otherwise normal pancreatic
contour. No surrounding inflammatory changes. No main pancreatic
ductal dilatation.
Spleen: Not enlarged. No focal lesion.

Adrenal Glands: No nodularity bilaterally.

Kidneys:

Bilateral renal cortical scarring. Bilateral nephrolithiasis
measuring up to 8 mm on the left and 4 mm on the right. No
hydroureteronephrosis bilaterally. Three consecutive calcifications
within the left pelvis measuring up to 9 mm likely within the distal
left ureter ([DATE]). No right ureterolithiasis. No contour
deforming renal mass.

The urinary bladder is unremarkable.

Bowel: No small or large bowel wall thickening or dilatation.
Diffuse colonic diverticulosis. Poorly visualized transverse colon
due to motion artifact. The appendix is unremarkable.

Mesentery, Omentum, and Peritoneum: No simple free fluid ascites. No
pneumoperitoneum. No mesenteric hematoma identified. No organized
fluid collection.

Pelvic Organs: The prostate is prominent.

Lymph Nodes: No abdominal, pelvic, inguinal lymphadenopathy.

Vasculature: No abdominal aorta or iliac aneurysm.

Musculoskeletal:

There is a lobulated 12.5 x 6 by 9.5 cm fluid density lesion along

Bilateral, right greater than left, fat containing inguinal hernias.

No acute pelvic fracture.

CT THORACIC SPINE FINDINGS

Alignment: Normal.

Vertebrae: Multilevel mild degenerative changes of the spine. No
acute fracture or focal pathologic process.No severe osseous neural
foraminal or central canal stenosis.

Paraspinal and other soft tissues: Negative.

CT LUMBAR SPINE FINDINGS

Segmentation: 5 lumbar type vertebrae.

Alignment: Normal.

Vertebrae: Age-indeterminate, likely chronic, L1 compression
fracture with greater than 20% height loss. Otherwise no definite
acute fracture or focal pathologic process. Multilevel mild
degenerative changes of the spine with moderate severe facet
arthropathy of the lower lumbar spine. No severe osseous neural
foraminal or central canal stenosis.

Paraspinal and other soft tissues: Negative.
IMPRESSION: 1.
2. No acute intracranial abnormality.
3. No acute displaced fracture or traumatic listhesis of the
cervical spine.
4. No acute traumatic injury to the chest, abdomen, or pelvis with
limited evaluation on this noncontrast study. Please see below
regarding positive non-traumatic findings.

5. No definite acute fracture or traumatic malalignment of the
thoracic or lumbar spine. Age-indeterminate, likely chronic, L1
compression fracture with greater than 20% height loss. Correlate
with tenderness to palpation to evaluate for an acute component.
6. Some limited evaluation due to respiratory motion artifact.

Other imaging findings of potential clinical significance:

1. Three consecutive calcifications within the left pelvis measuring
up to 9 mm likely within the distal left ureter. No associated
proximal hydroureteronephrosis. Recommend correlation with
urinalysis.
2. Bilateral nonobstructive nephrolithiasis measuring up to 8 mm on
the left and 4 mm on the right.
3. A lobulated 12.5 x 6 x 9.5 cm fluid density lesion along the
right lateral upper abdomen subcutaneus soft tissues is incompletely
evaluated on this noncontrast study. Finding could represent a
sebaceous cyst versus liquefied hematoma versus likely benign
neoplasm. Correlate with physical exam. Correlate with prior
cross-sectional imaging to evaluate for stability.
4. Scattered pulmonary micronodules calcified and noncalcified. No
follow-up needed if patient is low-risk (and has no known or
suspected primary neoplasm). Non-contrast chest CT can be considered
in 12 months if patient is high-risk. This recommendation follows
the consensus statement: Guidelines for Management of Incidental
Pulmonary Nodules Detected on CT Images: From the [HOSPITAL]
5. Diffuse colonic diverticulosis with no acute diverticulitis.
6. Prominent prostate
7. Bilateral, right greater than left, fat containing inguinal
hernias.
8.  Aortic Atherosclerosis (FHJF6-8SI.I).
